# Patient Record
Sex: Female | Born: 1950 | Race: White | Hispanic: No | Smoking: Former smoker
Health system: Southern US, Community
[De-identification: ages and names within clinical notes are randomized; demographics above are authoritative.]

## PROBLEM LIST (undated history)

## (undated) DIAGNOSIS — F329 Major depressive disorder, single episode, unspecified: Secondary | ICD-10-CM

## (undated) DIAGNOSIS — R51 Headache: Secondary | ICD-10-CM

## (undated) DIAGNOSIS — E7800 Pure hypercholesterolemia, unspecified: Secondary | ICD-10-CM

## (undated) DIAGNOSIS — F32A Depression, unspecified: Secondary | ICD-10-CM

## (undated) DIAGNOSIS — H9190 Unspecified hearing loss, unspecified ear: Secondary | ICD-10-CM

## (undated) DIAGNOSIS — M8580 Other specified disorders of bone density and structure, unspecified site: Secondary | ICD-10-CM

## (undated) DIAGNOSIS — R0602 Shortness of breath: Secondary | ICD-10-CM

## (undated) DIAGNOSIS — G5793 Unspecified mononeuropathy of bilateral lower limbs: Secondary | ICD-10-CM

## (undated) DIAGNOSIS — N6019 Diffuse cystic mastopathy of unspecified breast: Secondary | ICD-10-CM

## (undated) DIAGNOSIS — E559 Vitamin D deficiency, unspecified: Secondary | ICD-10-CM

## (undated) DIAGNOSIS — B009 Herpesviral infection, unspecified: Secondary | ICD-10-CM

## (undated) DIAGNOSIS — G5790 Unspecified mononeuropathy of unspecified lower limb: Secondary | ICD-10-CM

## (undated) DIAGNOSIS — Z6825 Body mass index (BMI) 25.0-25.9, adult: Secondary | ICD-10-CM

## (undated) HISTORY — DX: Unspecified mononeuropathy of unspecified lower limb: G57.90

## (undated) HISTORY — DX: Herpesviral infection, unspecified: B00.9

## (undated) HISTORY — DX: Shortness of breath: R06.02

## (undated) HISTORY — DX: Diffuse cystic mastopathy of unspecified breast: N60.19

## (undated) HISTORY — DX: Unspecified mononeuropathy of bilateral lower limbs: G57.93

## (undated) HISTORY — DX: Other specified disorders of bone density and structure, unspecified site: M85.80

## (undated) HISTORY — DX: Body mass index (BMI) 25.0-25.9, adult: Z68.25

## (undated) HISTORY — DX: Pure hypercholesterolemia, unspecified: E78.00

## (undated) HISTORY — DX: Unspecified hearing loss, unspecified ear: H91.90

## (undated) HISTORY — DX: Vitamin D deficiency, unspecified: E55.9

---

## 1999-02-25 ENCOUNTER — Encounter: Admission: RE | Admit: 1999-02-25 | Discharge: 1999-02-25 | Payer: Self-pay | Admitting: Family Medicine

## 1999-02-25 ENCOUNTER — Encounter: Payer: Self-pay | Admitting: Family Medicine

## 1999-10-25 ENCOUNTER — Other Ambulatory Visit: Admission: RE | Admit: 1999-10-25 | Discharge: 1999-10-25 | Payer: Self-pay | Admitting: *Deleted

## 1999-11-11 ENCOUNTER — Encounter: Payer: Self-pay | Admitting: *Deleted

## 1999-11-11 ENCOUNTER — Encounter: Admission: RE | Admit: 1999-11-11 | Discharge: 1999-11-11 | Payer: Self-pay | Admitting: *Deleted

## 2000-12-31 ENCOUNTER — Other Ambulatory Visit: Admission: RE | Admit: 2000-12-31 | Discharge: 2000-12-31 | Payer: Self-pay | Admitting: Family Medicine

## 2000-12-31 ENCOUNTER — Encounter: Payer: Self-pay | Admitting: Family Medicine

## 2000-12-31 ENCOUNTER — Encounter: Admission: RE | Admit: 2000-12-31 | Discharge: 2000-12-31 | Payer: Self-pay | Admitting: Family Medicine

## 2001-01-07 ENCOUNTER — Encounter: Payer: Self-pay | Admitting: Family Medicine

## 2001-01-07 ENCOUNTER — Encounter: Admission: RE | Admit: 2001-01-07 | Discharge: 2001-01-07 | Payer: Self-pay | Admitting: Family Medicine

## 2001-07-29 ENCOUNTER — Encounter: Admission: RE | Admit: 2001-07-29 | Discharge: 2001-08-20 | Payer: Self-pay | Admitting: Orthopedic Surgery

## 2001-12-31 ENCOUNTER — Encounter: Payer: Self-pay | Admitting: Family Medicine

## 2001-12-31 ENCOUNTER — Encounter: Admission: RE | Admit: 2001-12-31 | Discharge: 2001-12-31 | Payer: Self-pay | Admitting: Family Medicine

## 2002-01-30 ENCOUNTER — Ambulatory Visit (HOSPITAL_BASED_OUTPATIENT_CLINIC_OR_DEPARTMENT_OTHER): Admission: RE | Admit: 2002-01-30 | Discharge: 2002-01-30 | Payer: Self-pay | Admitting: Surgery

## 2002-02-10 ENCOUNTER — Encounter: Admission: RE | Admit: 2002-02-10 | Discharge: 2002-02-10 | Payer: Self-pay | Admitting: Family Medicine

## 2002-02-10 ENCOUNTER — Encounter: Payer: Self-pay | Admitting: Family Medicine

## 2002-02-12 ENCOUNTER — Encounter: Admission: RE | Admit: 2002-02-12 | Discharge: 2002-02-12 | Payer: Self-pay | Admitting: Family Medicine

## 2002-02-12 ENCOUNTER — Encounter: Payer: Self-pay | Admitting: Family Medicine

## 2003-03-03 ENCOUNTER — Ambulatory Visit (HOSPITAL_COMMUNITY): Admission: RE | Admit: 2003-03-03 | Discharge: 2003-03-03 | Payer: Self-pay | Admitting: Internal Medicine

## 2003-04-01 ENCOUNTER — Ambulatory Visit (HOSPITAL_COMMUNITY): Admission: RE | Admit: 2003-04-01 | Discharge: 2003-04-01 | Payer: Self-pay | Admitting: Internal Medicine

## 2003-04-22 ENCOUNTER — Encounter: Admission: RE | Admit: 2003-04-22 | Discharge: 2003-05-21 | Payer: Self-pay | Admitting: Internal Medicine

## 2003-05-08 ENCOUNTER — Ambulatory Visit (HOSPITAL_COMMUNITY): Admission: RE | Admit: 2003-05-08 | Discharge: 2003-05-08 | Payer: Self-pay | Admitting: Internal Medicine

## 2004-01-13 ENCOUNTER — Ambulatory Visit: Payer: Self-pay | Admitting: Internal Medicine

## 2004-01-15 ENCOUNTER — Encounter: Admission: RE | Admit: 2004-01-15 | Discharge: 2004-01-15 | Payer: Self-pay | Admitting: Internal Medicine

## 2004-02-01 ENCOUNTER — Ambulatory Visit: Payer: Self-pay | Admitting: Internal Medicine

## 2004-02-04 ENCOUNTER — Ambulatory Visit (HOSPITAL_COMMUNITY): Admission: RE | Admit: 2004-02-04 | Discharge: 2004-02-04 | Payer: Self-pay | Admitting: Internal Medicine

## 2004-02-12 ENCOUNTER — Ambulatory Visit: Payer: Self-pay | Admitting: Internal Medicine

## 2004-02-18 ENCOUNTER — Encounter: Admission: RE | Admit: 2004-02-18 | Discharge: 2004-02-18 | Payer: Self-pay | Admitting: Internal Medicine

## 2004-02-19 ENCOUNTER — Ambulatory Visit (HOSPITAL_COMMUNITY): Admission: RE | Admit: 2004-02-19 | Discharge: 2004-02-19 | Payer: Self-pay | Admitting: Internal Medicine

## 2004-03-25 IMAGING — US US TRANSVAGINAL NON-OB
1 series · 14 of 25 positions shown · non-contrast
Comparison: none

CLINICAL DATA: Vaginal bleeding and pain and cramping.  
 TRANSABDOMINAL AND TRANSVAGINAL PELVIC ULTRASOUND
 The uterus is retroflexed but is normal in size measuring approximately 7 cm in length.  No fibroids are seen.  Several tiny cervical nabothian cysts are noted but no other abnormalities are seen involving the uterus.  On transvaginal sonography the double layer endometrial thickness measures 1.4 mm, which is within normal limits.  
 The left ovary was directly visualized on transvaginal sonography and is normal in appearance.  The right ovary could not be directly visualized by either transabdominal or transvaginal sonography, however no right-sided adnexal masses or cysts are seen.  There is no evidence of free fluid.  
 IMPRESSION
 Retroverted uterus.  Normal endometrial thickness measuring 1.4 mm.  No evidence of fibroids.
 Normal appearance of the left ovary.  Nonvisualization of the right ovary.  No evidence of adnexal mass or free fluid.

[Series 1: unknown · 0.27mm/px · 14 of 32 slices shown]
[im 1/32]
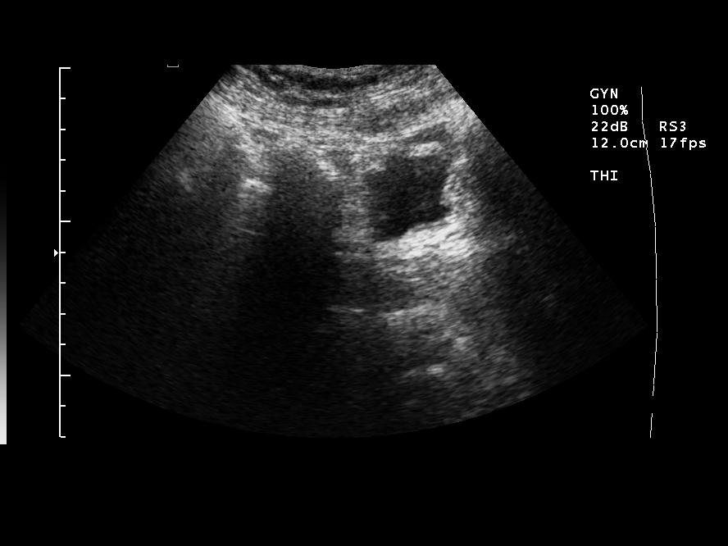
[im 3/32]
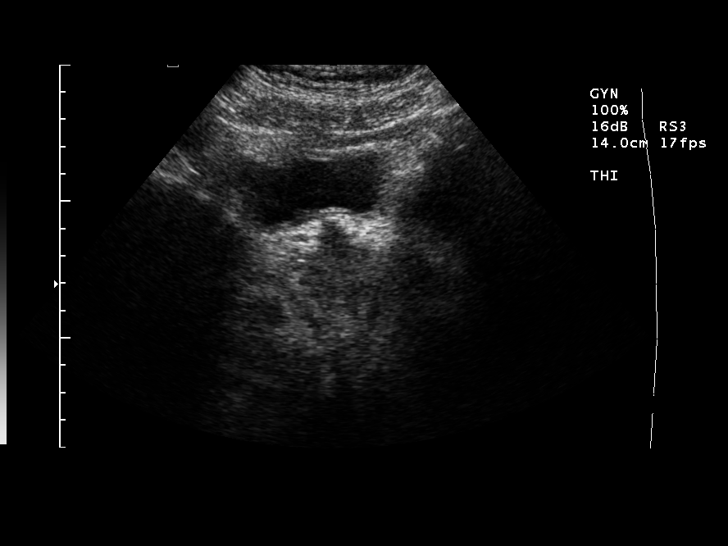
[im 6/32]
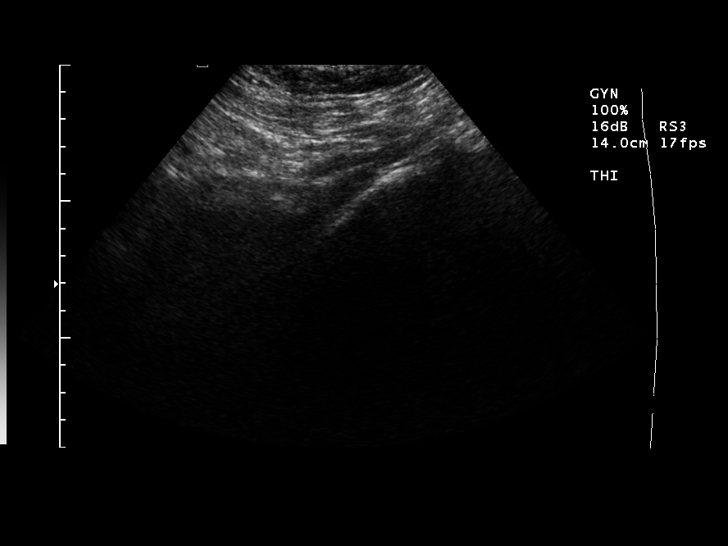
[im 8/32]
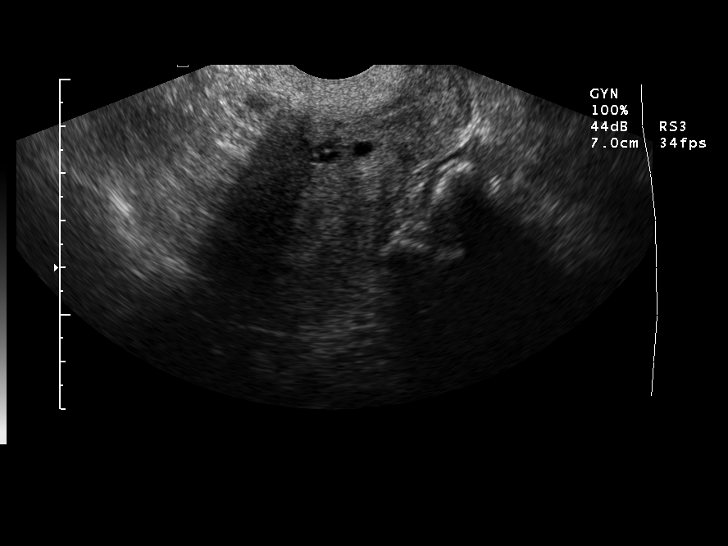
[im 11/32]
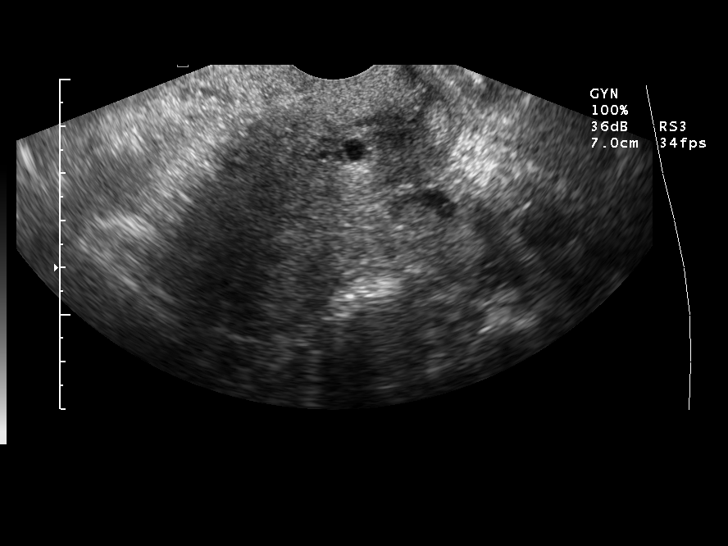
[im 12/32]
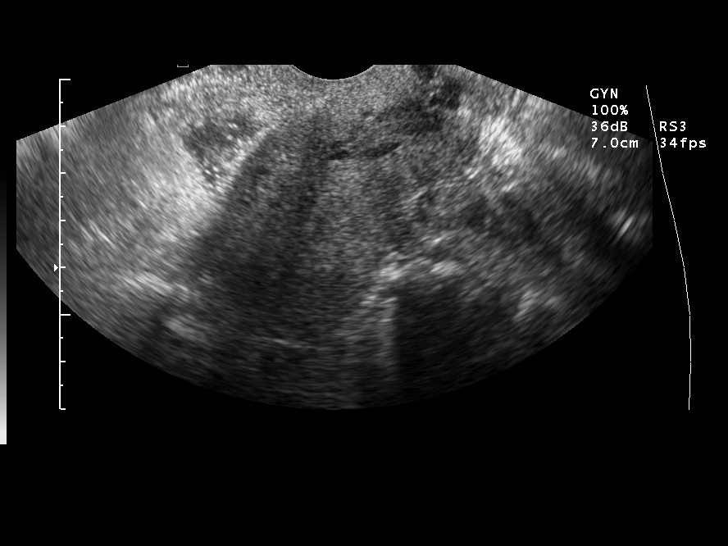
[im 15/32]
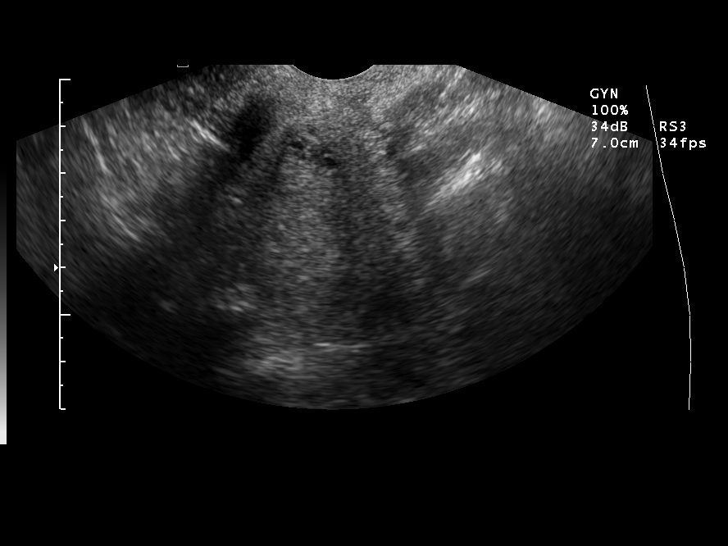
[im 17/32]
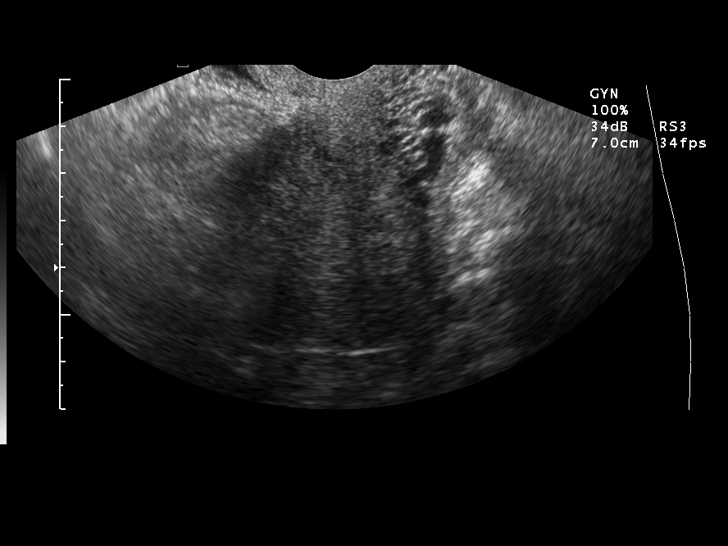
[im 20/32]
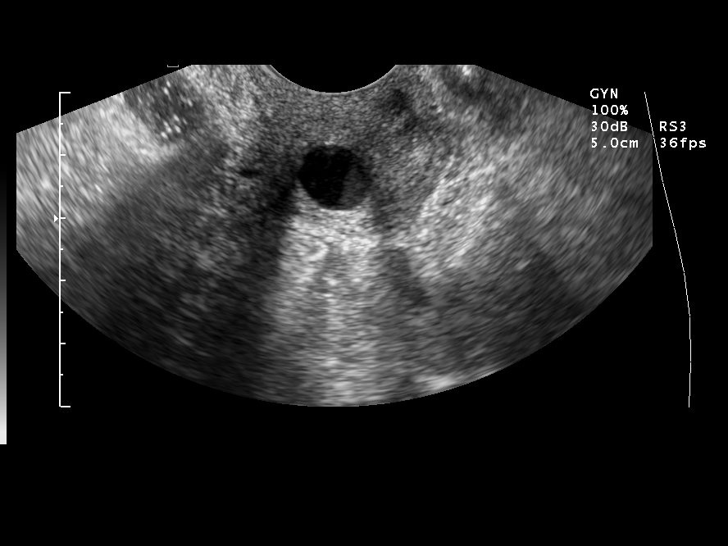
[im 21/32]
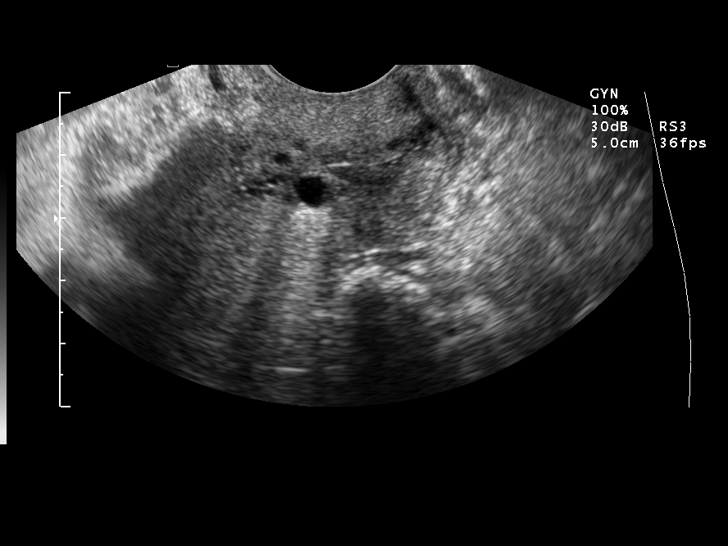
[im 24/32]
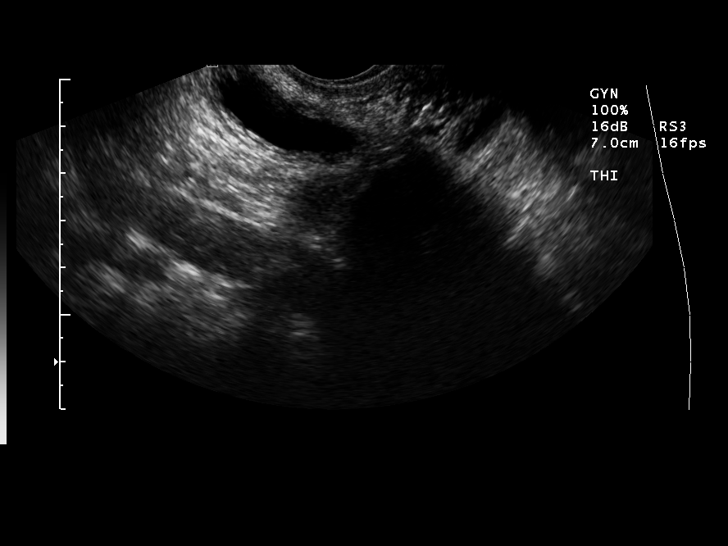
[im 26/32]
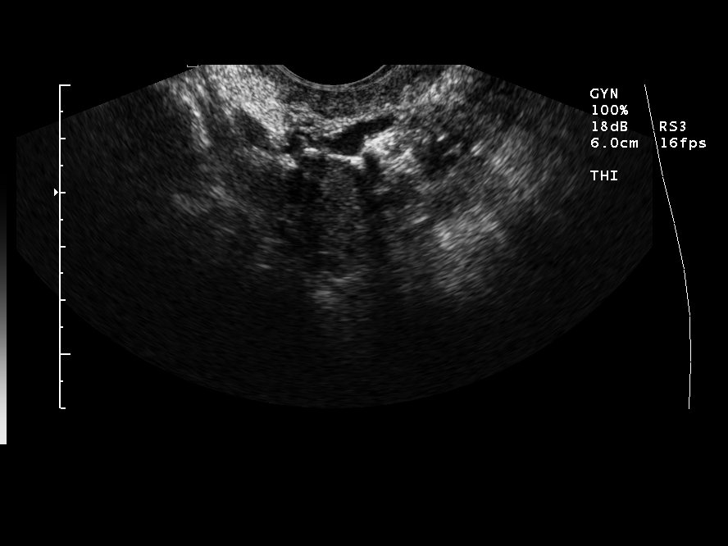
[im 29/32]
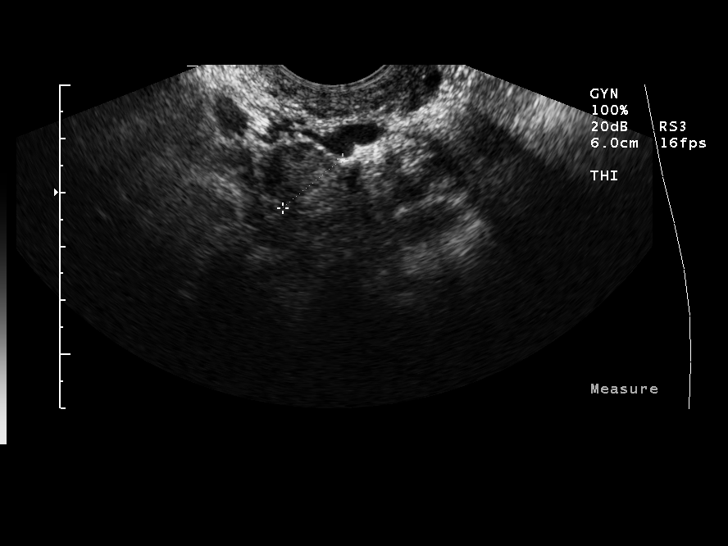
[im 32/32]
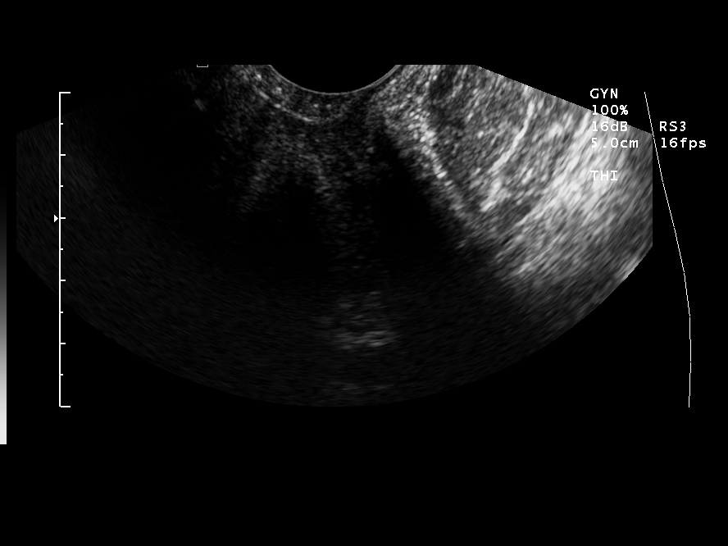

[14 of 25 positions shown; findings below may reference images not displayed]

## 2004-06-23 ENCOUNTER — Ambulatory Visit: Payer: Self-pay | Admitting: Internal Medicine

## 2004-07-14 ENCOUNTER — Ambulatory Visit: Payer: Self-pay | Admitting: Internal Medicine

## 2004-07-17 ENCOUNTER — Ambulatory Visit (HOSPITAL_COMMUNITY): Admission: RE | Admit: 2004-07-17 | Discharge: 2004-07-17 | Payer: Self-pay | Admitting: Internal Medicine

## 2005-03-08 ENCOUNTER — Other Ambulatory Visit: Admission: RE | Admit: 2005-03-08 | Discharge: 2005-03-08 | Payer: Self-pay | Admitting: *Deleted

## 2005-03-23 ENCOUNTER — Encounter: Admission: RE | Admit: 2005-03-23 | Discharge: 2005-03-23 | Payer: Self-pay | Admitting: Family Medicine

## 2005-09-06 ENCOUNTER — Ambulatory Visit: Payer: Self-pay | Admitting: Internal Medicine

## 2005-09-07 ENCOUNTER — Ambulatory Visit (HOSPITAL_COMMUNITY): Admission: RE | Admit: 2005-09-07 | Discharge: 2005-09-07 | Payer: Self-pay | Admitting: Internal Medicine

## 2005-09-18 ENCOUNTER — Ambulatory Visit: Payer: Self-pay | Admitting: Internal Medicine

## 2005-12-05 ENCOUNTER — Ambulatory Visit (HOSPITAL_COMMUNITY): Admission: RE | Admit: 2005-12-05 | Discharge: 2005-12-05 | Payer: Self-pay | Admitting: Interventional Cardiology

## 2006-05-10 ENCOUNTER — Ambulatory Visit: Payer: Self-pay | Admitting: Internal Medicine

## 2006-05-11 ENCOUNTER — Ambulatory Visit: Payer: Self-pay | Admitting: *Deleted

## 2006-06-01 ENCOUNTER — Ambulatory Visit: Payer: Self-pay | Admitting: Internal Medicine

## 2006-06-01 ENCOUNTER — Encounter: Payer: Self-pay | Admitting: Internal Medicine

## 2006-06-07 ENCOUNTER — Ambulatory Visit: Payer: Self-pay | Admitting: Internal Medicine

## 2006-06-09 ENCOUNTER — Emergency Department (HOSPITAL_COMMUNITY): Admission: EM | Admit: 2006-06-09 | Discharge: 2006-06-10 | Payer: Self-pay | Admitting: Emergency Medicine

## 2006-06-14 ENCOUNTER — Ambulatory Visit: Payer: Self-pay | Admitting: Internal Medicine

## 2006-06-19 ENCOUNTER — Ambulatory Visit (HOSPITAL_COMMUNITY): Admission: RE | Admit: 2006-06-19 | Discharge: 2006-06-19 | Payer: Self-pay | Admitting: Internal Medicine

## 2006-06-26 ENCOUNTER — Ambulatory Visit (HOSPITAL_COMMUNITY): Admission: RE | Admit: 2006-06-26 | Discharge: 2006-06-26 | Payer: Self-pay | Admitting: Family Medicine

## 2006-07-27 ENCOUNTER — Ambulatory Visit: Payer: Self-pay | Admitting: Internal Medicine

## 2006-10-24 ENCOUNTER — Emergency Department (HOSPITAL_COMMUNITY): Admission: EM | Admit: 2006-10-24 | Discharge: 2006-10-24 | Payer: Self-pay | Admitting: Emergency Medicine

## 2006-11-30 ENCOUNTER — Emergency Department (HOSPITAL_COMMUNITY): Admission: EM | Admit: 2006-11-30 | Discharge: 2006-11-30 | Payer: Self-pay | Admitting: Emergency Medicine

## 2006-12-06 ENCOUNTER — Ambulatory Visit: Payer: Self-pay | Admitting: Internal Medicine

## 2006-12-26 ENCOUNTER — Encounter (INDEPENDENT_AMBULATORY_CARE_PROVIDER_SITE_OTHER): Payer: Self-pay | Admitting: *Deleted

## 2007-01-28 ENCOUNTER — Ambulatory Visit: Payer: Self-pay | Admitting: Internal Medicine

## 2007-02-11 ENCOUNTER — Ambulatory Visit: Payer: Self-pay | Admitting: Family Medicine

## 2007-03-11 ENCOUNTER — Emergency Department (HOSPITAL_COMMUNITY): Admission: EM | Admit: 2007-03-11 | Discharge: 2007-03-11 | Payer: Self-pay | Admitting: Emergency Medicine

## 2007-06-07 ENCOUNTER — Ambulatory Visit: Payer: Self-pay | Admitting: Family Medicine

## 2007-06-07 ENCOUNTER — Encounter (INDEPENDENT_AMBULATORY_CARE_PROVIDER_SITE_OTHER): Payer: Self-pay | Admitting: Family Medicine

## 2007-06-07 LAB — CONVERTED CEMR LAB
ALT: 19 units/L (ref 0–35)
AST: 23 units/L (ref 0–37)
Albumin: 3.9 g/dL (ref 3.5–5.2)
Alkaline Phosphatase: 54 units/L (ref 39–117)
BUN: 16 mg/dL (ref 6–23)
Basophils Absolute: 0 10*3/uL (ref 0.0–0.1)
Basophils Relative: 0 % (ref 0–1)
CO2: 23 meq/L (ref 19–32)
Calcium: 9.5 mg/dL (ref 8.4–10.5)
Chloride: 104 meq/L (ref 96–112)
Cholesterol: 246 mg/dL — ABNORMAL HIGH (ref 0–200)
Creatinine, Ser: 0.7 mg/dL (ref 0.40–1.20)
Eosinophils Absolute: 0 10*3/uL (ref 0.0–0.7)
Eosinophils Relative: 1 % (ref 0–5)
Glucose, Bld: 91 mg/dL (ref 70–99)
HCT: 39.9 % (ref 36.0–46.0)
HDL: 53 mg/dL (ref >39)
Hemoglobin: 13 g/dL (ref 12.0–15.0)
LDL Cholesterol: 155 mg/dL — ABNORMAL HIGH (ref 0–99)
Lymphocytes Relative: 40 % (ref 12–46)
Lymphs Abs: 2.2 10*3/uL (ref 0.7–4.0)
MCHC: 32.6 g/dL (ref 30.0–36.0)
MCV: 94.3 fL (ref 78.0–100.0)
Monocytes Absolute: 0.5 10*3/uL (ref 0.1–1.0)
Monocytes Relative: 9 % (ref 3–12)
Neutro Abs: 2.7 10*3/uL (ref 1.7–7.7)
Neutrophils Relative %: 51 % (ref 43–77)
Platelets: 185 10*3/uL (ref 150–400)
Potassium: 4.4 meq/L (ref 3.5–5.3)
RBC: 4.23 M/uL (ref 3.87–5.11)
RDW: 13.2 % (ref 11.5–15.5)
Sodium: 141 meq/L (ref 135–145)
TSH: 1.986 microintl units/mL (ref 0.350–5.50)
Total Bilirubin: 0.3 mg/dL (ref 0.3–1.2)
Total CHOL/HDL Ratio: 4.6
Total Protein: 6.4 g/dL (ref 6.0–8.3)
Triglycerides: 188 mg/dL — ABNORMAL HIGH (ref <150)
VLDL: 38 mg/dL (ref 0–40)
WBC: 5.4 10*3/uL (ref 4.0–10.5)

## 2007-07-15 ENCOUNTER — Ambulatory Visit: Payer: Self-pay | Admitting: Internal Medicine

## 2007-08-28 ENCOUNTER — Ambulatory Visit (HOSPITAL_COMMUNITY): Admission: RE | Admit: 2007-08-28 | Discharge: 2007-08-28 | Payer: Self-pay | Admitting: Family Medicine

## 2007-09-04 ENCOUNTER — Ambulatory Visit: Payer: Self-pay | Admitting: Internal Medicine

## 2007-09-17 ENCOUNTER — Ambulatory Visit: Payer: Self-pay | Admitting: Internal Medicine

## 2008-10-13 ENCOUNTER — Ambulatory Visit: Payer: Self-pay | Admitting: Family Medicine

## 2008-12-30 ENCOUNTER — Ambulatory Visit: Payer: Self-pay | Admitting: Internal Medicine

## 2008-12-30 LAB — CONVERTED CEMR LAB
ALT: 18 units/L (ref 0–35)
AST: 25 units/L (ref 0–37)
Albumin: 4.4 g/dL (ref 3.5–5.2)
Alkaline Phosphatase: 52 units/L (ref 39–117)
BUN: 20 mg/dL (ref 6–23)
Basophils Absolute: 0 10*3/uL (ref 0.0–0.1)
Basophils Relative: 0 % (ref 0–1)
CO2: 25 meq/L (ref 19–32)
Calcium: 10.3 mg/dL (ref 8.4–10.5)
Chloride: 104 meq/L (ref 96–112)
Creatinine, Ser: 0.88 mg/dL (ref 0.40–1.20)
Eosinophils Absolute: 0.1 10*3/uL (ref 0.0–0.7)
Eosinophils Relative: 1 % (ref 0–5)
Glucose, Bld: 87 mg/dL (ref 70–99)
HCT: 42.2 % (ref 36.0–46.0)
Hemoglobin: 13.7 g/dL (ref 12.0–15.0)
Lymphocytes Relative: 37 % (ref 12–46)
Lymphs Abs: 2.5 10*3/uL (ref 0.7–4.0)
MCHC: 32.5 g/dL (ref 30.0–36.0)
MCV: 94 fL (ref 78.0–100.0)
Monocytes Absolute: 0.5 10*3/uL (ref 0.1–1.0)
Monocytes Relative: 7 % (ref 3–12)
Neutro Abs: 3.7 10*3/uL (ref 1.7–7.7)
Neutrophils Relative %: 55 % (ref 43–77)
Platelets: 208 10*3/uL (ref 150–400)
Potassium: 5.2 meq/L (ref 3.5–5.3)
RBC: 4.49 M/uL (ref 3.87–5.11)
RDW: 12.5 % (ref 11.5–15.5)
Sodium: 139 meq/L (ref 135–145)
TSH: 1.942 microintl units/mL (ref 0.350–4.500)
Total Bilirubin: 0.3 mg/dL (ref 0.3–1.2)
Total Protein: 7.4 g/dL (ref 6.0–8.3)
WBC: 6.7 10*3/uL (ref 4.0–10.5)

## 2009-04-26 ENCOUNTER — Ambulatory Visit: Payer: Self-pay | Admitting: Internal Medicine

## 2009-05-07 ENCOUNTER — Ambulatory Visit: Payer: Self-pay | Admitting: Family Medicine

## 2009-08-18 ENCOUNTER — Ambulatory Visit (HOSPITAL_COMMUNITY): Admission: RE | Admit: 2009-08-18 | Discharge: 2009-08-18 | Payer: Self-pay | Admitting: Family Medicine

## 2009-08-25 ENCOUNTER — Ambulatory Visit: Payer: Self-pay | Admitting: Family Medicine

## 2009-09-27 ENCOUNTER — Ambulatory Visit: Payer: Self-pay | Admitting: Internal Medicine

## 2009-09-27 LAB — CONVERTED CEMR LAB
ALT: 28 units/L (ref 0–35)
AST: 33 units/L (ref 0–37)
Albumin: 4.4 g/dL (ref 3.5–5.2)
Alkaline Phosphatase: 60 units/L (ref 39–117)
BUN: 17 mg/dL (ref 6–23)
CO2: 24 meq/L (ref 19–32)
Calcium: 10.2 mg/dL (ref 8.4–10.5)
Chloride: 106 meq/L (ref 96–112)
Cholesterol: 306 mg/dL — ABNORMAL HIGH (ref 0–200)
Creatinine, Ser: 0.77 mg/dL (ref 0.40–1.20)
Glucose, Bld: 94 mg/dL (ref 70–99)
HDL: 54 mg/dL (ref >39)
LDL Cholesterol: 229 mg/dL — ABNORMAL HIGH (ref 0–99)
Potassium: 5.3 meq/L (ref 3.5–5.3)
Sodium: 142 meq/L (ref 135–145)
TSH: 3.208 microintl units/mL (ref 0.350–4.500)
Total Bilirubin: 0.7 mg/dL (ref 0.3–1.2)
Total CHOL/HDL Ratio: 5.7
Total Protein: 7.1 g/dL (ref 6.0–8.3)
Triglycerides: 114 mg/dL (ref <150)
VLDL: 23 mg/dL (ref 0–40)

## 2009-10-03 ENCOUNTER — Emergency Department (HOSPITAL_COMMUNITY): Admission: EM | Admit: 2009-10-03 | Discharge: 2009-10-03 | Payer: Self-pay | Admitting: Family Medicine

## 2009-10-04 ENCOUNTER — Telehealth (INDEPENDENT_AMBULATORY_CARE_PROVIDER_SITE_OTHER): Payer: Self-pay | Admitting: *Deleted

## 2009-12-06 ENCOUNTER — Ambulatory Visit: Payer: Self-pay | Admitting: Internal Medicine

## 2010-05-01 ENCOUNTER — Encounter: Payer: Self-pay | Admitting: Interventional Cardiology

## 2010-05-10 NOTE — Progress Notes (Signed)
Summary: triage/abd pain  Phone Note Call from Patient   Caller: Patient Reason for Call: Talk to Nurse Summary of Call: Patient states she went to urgent care on 6/26 because she had been having abd pain for 2 days. She states she has had a fever that was 100.6 at its highest and denies any fever today. She also denies constipation/diarrhea or vomiting. She had normal bloodwork and urinalysis at urgent care. Report states low likelihood of appendicitis. Patient states she wears the leash around her waist when she walks her dog and her dog sometimes pulls alot..Advised patient not to do this as it is also very bad for her back. Patient also found a tick that was attached to her scalp yesterday. She states the tick was not engorged and it was attached. Advised patient to mark calendar as to when she found the tick. Offered patient acute slot in am..however she refused stating she had to work. Advised patient to call us if she changed her mind or she didn't continue to improve.

## 2010-06-26 LAB — POCT URINALYSIS DIP (DEVICE)
Bilirubin Urine: NEGATIVE
Glucose, UA: NEGATIVE mg/dL
Hgb urine dipstick: NEGATIVE
Ketones, ur: NEGATIVE mg/dL
Nitrite: NEGATIVE
Protein, ur: 30 mg/dL — AB
Specific Gravity, Urine: >=1.03 (ref 1.005–1.030)
Urobilinogen, UA: 1 mg/dL (ref 0.0–1.0)
pH: 5.5 (ref 5.0–8.0)

## 2010-06-26 LAB — CBC
HCT: 37.2 % (ref 36.0–46.0)
Hemoglobin: 12.7 g/dL (ref 12.0–15.0)
MCH: 31.6 pg (ref 26.0–34.0)
MCHC: 34 g/dL (ref 30.0–36.0)
MCV: 93 fL (ref 78.0–100.0)
Platelets: 172 10*3/uL (ref 150–400)
RBC: 4 MIL/uL (ref 3.87–5.11)
RDW: 13 % (ref 11.5–15.5)
WBC: 6.5 10*3/uL (ref 4.0–10.5)

## 2010-06-26 LAB — DIFFERENTIAL
Basophils Absolute: 0 10*3/uL (ref 0.0–0.1)
Basophils Relative: 1 % (ref 0–1)
Eosinophils Absolute: 0.1 10*3/uL (ref 0.0–0.7)
Eosinophils Relative: 1 % (ref 0–5)
Lymphocytes Relative: 38 % (ref 12–46)
Lymphs Abs: 2.5 10*3/uL (ref 0.7–4.0)
Monocytes Absolute: 0.6 10*3/uL (ref 0.1–1.0)
Monocytes Relative: 9 % (ref 3–12)
Neutro Abs: 3.3 10*3/uL (ref 1.7–7.7)
Neutrophils Relative %: 52 % (ref 43–77)

## 2010-11-13 ENCOUNTER — Encounter: Payer: Self-pay | Admitting: Emergency Medicine

## 2010-11-13 ENCOUNTER — Emergency Department (HOSPITAL_BASED_OUTPATIENT_CLINIC_OR_DEPARTMENT_OTHER)
Admission: EM | Admit: 2010-11-13 | Discharge: 2010-11-13 | Disposition: A | Discharge status: Home / Self Care | Payer: Self-pay | Attending: Emergency Medicine | Admitting: Emergency Medicine

## 2010-11-13 DIAGNOSIS — S81859A Open bite, unspecified lower leg, initial encounter: Secondary | ICD-10-CM

## 2010-11-13 DIAGNOSIS — S81009A Unspecified open wound, unspecified knee, initial encounter: Secondary | ICD-10-CM | POA: Insufficient documentation

## 2010-11-13 DIAGNOSIS — W540XXA Bitten by dog, initial encounter: Secondary | ICD-10-CM | POA: Insufficient documentation

## 2010-11-13 DIAGNOSIS — Y92009 Unspecified place in unspecified non-institutional (private) residence as the place of occurrence of the external cause: Secondary | ICD-10-CM | POA: Insufficient documentation

## 2010-11-13 HISTORY — DX: Headache: R51

## 2010-11-13 HISTORY — DX: Depression, unspecified: F32.A

## 2010-11-13 HISTORY — DX: Major depressive disorder, single episode, unspecified: F32.9

## 2010-11-13 MED ORDER — AMOXICILLIN-POT CLAVULANATE 875-125 MG PO TABS: 1.0000 | ORAL_TABLET | Freq: Two times a day (BID) | ORAL | Status: AC

## 2010-11-13 MED ORDER — AMOXICILLIN-POT CLAVULANATE 875-125 MG PO TABS
1.0000 | ORAL_TABLET | Freq: Once | ORAL | Status: AC
  Administered 2010-11-13: 1 via ORAL
  Filled 2010-11-13: qty 1

## 2010-11-13 NOTE — ED Notes (Signed)
Per EMS:  Pt has dog bite on right lower leg this am.  Pt has two puncture wounds.  Pt bit by own dog.  Shots up to date.

## 2010-11-13 NOTE — ED Provider Notes (Addendum)
History     CSN: [id number] Arrival date & time: 11/13/2010  8:50 AM  Chief Complaint  Patient presents with  . Animal Bite   HPI Comments: Patient is a 60 year old woman who was walking to her dogs. Dog belonging to her neighbor attacked her dogs. She tried to separate the dogs, and when she did, her own dog bit her on the right lower leg. She is up-to-date on tetanus shots. Her dog has been immunized against rabies.  Patient is a 60 y.o. female presenting with animal bite. The history is provided by the patient and the EMS personnel. No language interpreter was used.  Animal Bite  The incident occurred just prior to arrival. The incident occurred in the street. She came to the ER via EMS. There is an injury to the right lower leg. The pain is mild. It is unlikely that a foreign body is present.    Past Medical History  Diagnosis Date  . Depression   . Headache     Past Surgical History  Procedure Date  . Cesarean section     History reviewed. No pertinent family history.  History  Substance Use Topics  . Smoking status: Never Smoker   . Smokeless tobacco: Not on file  . Alcohol Use: No    OB History    Grav Para Term Preterm Abortions TAB SAB Ect Mult Living                  Review of Systems  All other systems reviewed and are negative.    Physical Exam  BP 149/85  Pulse 76  Temp(Src) 97.6 F (36.4 C) (Oral)  Resp 18  SpO2 100%  Physical Exam  Constitutional: She is oriented to person, place, and time. She appears well-developed and well-nourished. Distressed: she is in mild distress with plates on the right lower leg.  HENT:  Head: Normocephalic.  Eyes: EOM are normal.  Neck: Normal range of motion. Neck supple.  Musculoskeletal:       Patient has 21 cm bite mark/lacerations on the anterior surface of the right calf. There are scratches along the right calf.  Neurological: She is alert and oriented to person, place, and time.       No sensory or motor  deficit.  Skin: Skin is warm and dry.  Psychiatric: She has a normal mood and affect. Her behavior is normal.    ED Course  Procedures  Course in the ED: Patient was seen, and had physical exam. We discussed treatment options. Where her dog bite wounds gape open somewhat, I had suggested suturing them. The patient was concerned about this, so in the end we decided to adopt a conservative approach, washing the wounds and leaving them open, and treating her with Augmentin to try to prevent infection. She does not need a tetanus shot, as the dog that bit her is immunized against rabies.   Carleene Cooper III, MD 11/13/10 [id number]  Carleene Cooper III, MD 11/13/10 [phone]

## 2010-11-13 NOTE — ED Notes (Signed)
Wounds cleansed with betadine and wound cleansing spray.  Applied bacitracin ointment, dry sterile dressing and secured with tape.  Pt tolerated well.

## 2010-11-13 NOTE — ED Notes (Signed)
Pt given cab voucher for ride home 

## 2010-11-24 ENCOUNTER — Inpatient Hospital Stay (INDEPENDENT_AMBULATORY_CARE_PROVIDER_SITE_OTHER)
Admission: RE | Admit: 2010-11-24 | Discharge: 2010-11-24 | Disposition: A | Discharge status: Home / Self Care | Payer: Self-pay | Source: Ambulatory Visit | Attending: Emergency Medicine | Admitting: Emergency Medicine

## 2010-11-24 DIAGNOSIS — S81809A Unspecified open wound, unspecified lower leg, initial encounter: Secondary | ICD-10-CM

## 2011-01-20 LAB — POCT URINALYSIS DIP (DEVICE)
Bilirubin Urine: NEGATIVE
Glucose, UA: NEGATIVE
Hgb urine dipstick: NEGATIVE
Ketones, ur: NEGATIVE
Nitrite: NEGATIVE
Operator id: 200941
Protein, ur: NEGATIVE
Specific Gravity, Urine: 1.01
Urobilinogen, UA: 0.2
pH: 7.5

## 2011-01-23 LAB — POCT URINALYSIS DIP (DEVICE)
Bilirubin Urine: NEGATIVE
Glucose, UA: NEGATIVE
Ketones, ur: NEGATIVE
Nitrite: NEGATIVE
Operator id: 247071
Protein, ur: NEGATIVE
Specific Gravity, Urine: 1.015
Urobilinogen, UA: 0.2
pH: 5.5

## 2011-05-11 ENCOUNTER — Emergency Department (HOSPITAL_COMMUNITY)
Admission: EM | Admit: 2011-05-11 | Discharge: 2011-05-11 | Disposition: A | Discharge status: Home / Self Care | Payer: Self-pay | Source: Home / Self Care | Attending: Family Medicine | Admitting: Family Medicine

## 2011-05-11 ENCOUNTER — Encounter (HOSPITAL_COMMUNITY): Payer: Self-pay | Admitting: *Deleted

## 2011-05-11 DIAGNOSIS — J40 Bronchitis, not specified as acute or chronic: Secondary | ICD-10-CM

## 2011-05-11 DIAGNOSIS — J069 Acute upper respiratory infection, unspecified: Secondary | ICD-10-CM

## 2011-05-11 MED ORDER — AZITHROMYCIN 250 MG PO TABS: 250.0000 mg | ORAL_TABLET | Freq: Every day | ORAL | Status: AC

## 2011-05-11 MED ORDER — GUAIFENESIN-CODEINE 100-10 MG/5ML PO SYRP: 5.0000 mL | ORAL_SOLUTION | Freq: Four times a day (QID) | ORAL | Status: AC | PRN

## 2011-05-11 MED ORDER — NABUMETONE 500 MG PO TABS: 500.0000 mg | ORAL_TABLET | Freq: Two times a day (BID) | ORAL | Status: AC

## 2011-05-11 NOTE — ED Notes (Signed)
Pt  Also  Reports      Some   Neck   Pain        Which      She  Has      Had  In  Past  She  denys  Any  specefic  Recent      Injury  And  The  Pain is  Worse  On  Movement

## 2011-05-11 NOTE — ED Provider Notes (Signed)
History     CSN: [id number]  Arrival date & time 05/11/11  1005   First MD Initiated Contact with Patient 05/11/11 1152      Chief Complaint  Patient presents with  . Cough    (Consider location/radiation/quality/duration/timing/severity/associated sxs/prior treatment) HPI Comments: Patient reports cough and congestion x 5 days. No fever. Cough is dry with occ green sputum. Has runny nose and sneezing. Cough keeping her awake and causing her chest to hurt. No treatment pta  The history is provided by the patient.    Past Medical History  Diagnosis Date  . Depression   . Headache     Past Surgical History  Procedure Date  . Cesarean section     History reviewed. No pertinent family history.  History  Substance Use Topics  . Smoking status: Never Smoker   . Smokeless tobacco: Not on file  . Alcohol Use: No    OB History    Grav Para Term Preterm Abortions TAB SAB Ect Mult Living                  Review of Systems  Constitutional: Negative.   HENT: Positive for congestion and rhinorrhea. Negative for sore throat.   Respiratory: Positive for cough. Negative for wheezing.   Cardiovascular: Negative.   Gastrointestinal: Negative.   Genitourinary: Negative.     Allergies  Sulfa antibiotics  Home Medications   Current Outpatient Rx  Name Route Sig Dispense Refill  . AZITHROMYCIN 250 MG PO TABS Oral Take 1 tablet (250 mg total) by mouth daily. Take first 2 tablets together, then 1 every day until finished. 6 tablet 0  . FLUOXETINE HCL 10 MG PO TABS Oral Take 10 mg by mouth daily.      . GUAIFENESIN-CODEINE 100-10 MG/5ML PO SYRP Oral Take 5 mLs by mouth every 6 (six) hours as needed for cough. 120 mL 0  . NABUMETONE 500 MG PO TABS Oral Take 500 mg by mouth 2 (two) times daily.      Marland Kitchen NABUMETONE 500 MG PO TABS Oral Take 1 tablet (500 mg total) by mouth 2 (two) times daily. 60 tablet 0    BP 121/83  Pulse 80  Temp(Src) 99.4 F (37.4 C) (Oral)  Resp 19   SpO2 97%  Physical Exam  Constitutional: She appears well-developed and well-nourished. No distress.  HENT:  Head: Normocephalic and atraumatic.       Nasal congestion, ears clear, throat mild erythema  Neck: Normal range of motion. Neck supple. No thyromegaly present.  Cardiovascular: Normal rate and regular rhythm.   Pulmonary/Chest: Effort normal and breath sounds normal. She has no wheezes.       Congested cough  Lymphadenopathy:    She has no cervical adenopathy.  Skin: Skin is warm and dry.    ED Course  Procedures (including critical care time)  Labs Reviewed - No data to display No results found.   1. URI (upper respiratory infection)   2. Bronchitis       MDM          Randa Spike, MD 05/11/11 1256

## 2011-05-11 NOTE — ED Notes (Signed)
Pt  Reports  Symptoms  Of  Cough     Congestion    Stuffy  Nose              With  Sneezing       Symptoms   X  5  Days      The  Cough  Became  Productive  2  Days   Ago      Pt  Is  Masked       And  Is  In a  Private  Room              She  Is  Speaking  In  Complete  sentances

## 2011-05-17 ENCOUNTER — Telehealth (HOSPITAL_COMMUNITY): Payer: Self-pay | Admitting: *Deleted

## 2011-09-13 ENCOUNTER — Ambulatory Visit (HOSPITAL_COMMUNITY)
Admission: RE | Admit: 2011-09-13 | Discharge: 2011-09-13 | Disposition: A | Discharge status: Home / Self Care | Payer: Self-pay | Source: Ambulatory Visit | Attending: Family Medicine | Admitting: Family Medicine

## 2011-09-13 ENCOUNTER — Other Ambulatory Visit: Payer: Self-pay | Admitting: Family Medicine

## 2011-09-13 DIAGNOSIS — M5030 Other cervical disc degeneration, unspecified cervical region: Secondary | ICD-10-CM | POA: Insufficient documentation

## 2011-09-13 DIAGNOSIS — R52 Pain, unspecified: Secondary | ICD-10-CM | POA: Insufficient documentation

## 2011-09-13 DIAGNOSIS — M542 Cervicalgia: Secondary | ICD-10-CM | POA: Insufficient documentation

## 2011-12-30 ENCOUNTER — Emergency Department (INDEPENDENT_AMBULATORY_CARE_PROVIDER_SITE_OTHER): Payer: Self-pay

## 2011-12-30 ENCOUNTER — Encounter (HOSPITAL_COMMUNITY): Payer: Self-pay

## 2011-12-30 ENCOUNTER — Emergency Department (HOSPITAL_COMMUNITY)
Admission: EM | Admit: 2011-12-30 | Discharge: 2011-12-30 | Disposition: A | Discharge status: Home / Self Care | Payer: Self-pay | Source: Home / Self Care | Attending: Emergency Medicine | Admitting: Emergency Medicine

## 2011-12-30 DIAGNOSIS — M719 Bursopathy, unspecified: Secondary | ICD-10-CM

## 2011-12-30 DIAGNOSIS — M7580 Other shoulder lesions, unspecified shoulder: Secondary | ICD-10-CM

## 2011-12-30 DIAGNOSIS — M67919 Unspecified disorder of synovium and tendon, unspecified shoulder: Secondary | ICD-10-CM

## 2011-12-30 MED ORDER — METHYLPREDNISOLONE ACETATE 40 MG/ML IJ SUSP
INTRAMUSCULAR | Status: AC
  Filled 2011-12-30: qty 5

## 2011-12-30 NOTE — ED Notes (Signed)
Pt has lt shoulder pain and no known specific injury, but has been lifting her 50 lb dog and moving doghouses.

## 2011-12-30 NOTE — ED Provider Notes (Signed)
Chief Complaint  Patient presents with  . Shoulder Pain    History of Present Illness:   Caitlyn Fields is a 61 year old female who has had a 2 month history of left shoulder pain. She denies any specific injury to the shoulder but does a lot of heavy lifting of her dogs and lifts heavy buckets of water for her dogs. She describes pain in the posterior lateral shoulder area with radiation to the upper arm. This feels like a burning. She denies any radiation down below the elbow, numbness, tingling, or weakness. There is no swelling. Her shoulder has a full range of motion. She denies any pain with abduction. She sometimes feels like it's worse at the shoulders compressed.  Review of Systems:  Other than noted above, the patient denies any of the following symptoms: Systemic:  No fevers, chills, sweats, or aches.  No fatigue or tiredness. Musculoskeletal:  No joint pain, arthritis, bursitis, swelling, back pain, or neck pain. Neurological:  No muscular weakness, paresthesias, headache, or trouble with speech or coordination.  No dizziness.  PMFSH:  Past medical history, family history, social history, meds, and allergies were reviewed.  Physical Exam:   Vital signs:  BP 138/78  Pulse 75  Temp 97.8 F (36.6 C) (Oral)  Resp 16  SpO2 100% Gen:  Alert and oriented times 3.  In no distress. Musculoskeletal: No swelling, bruising, or deformity. There was no pain to palpation. Shoulder has a full range of motion with no pain. Neer test was negative.  Hawkins test was negative.  Empty cans test was negative. Otherwise, all joints had a full a ROM with no swelling, bruising or deformity.  No edema, pulses full. Extremities were warm and pink.  Capillary refill was brisk.  Skin:  Clear, warm and dry.  No rash. Neuro:  Alert and oriented times 3.  Muscle strength was normal.  Sensation was intact to light touch.   Radiology:  Dg Shoulder Left  12/30/2011  *RADIOLOGY REPORT*  Clinical Data: Left shoulder pain   LEFT SHOULDER - 2+ VIEW  Comparison: None.  Findings: No fracture or dislocation is seen.  The joint spaces are preserved.  The visualized soft tissues are unremarkable.  Visualized left lung is clear.  IMPRESSION: No acute osseous abnormality is seen.   Original Report Authenticated By: Charline Bills, M.D.    I reviewed the images independently and personally and concur with the radiologist's findings.  Procedure Note:  Verbal informed consent was obtained from the patient.  Risks and benefits were outlined with the patient.  Patient understands and accepts these risks.  Identity of the patient was confirmed verbally and by armband.    Procedure was performed as followed:  Posterior aspect of the shoulder was prepped with Betadine and alcohol and anesthetized with ethyl chloride spray. The subacromial space was an area with a 1/2 inch 27-gauge needle and 1 cc of Depo-Medrol 40 mg strength and 1 cc of Marcaine were injected.  Patient tolerated the procedure well without any immediate complications.  Assessment:  The encounter diagnosis was Rotator cuff tendonitis.  Plan:   1.  The following meds were prescribed:   New Prescriptions   No medications on file   2.  The patient was instructed in symptomatic care, including rest and activity, elevation, application of ice and compression.  Appropriate handouts were given. 3.  The patient was told to return if becoming worse in any way, if no better in 3 or 4 days, and given some  red flag symptoms that would indicate earlier return.   4.  The patient was told to follow up with Dr. Annell Greening if no better in 2 weeks.    Reuben Likes, MD 12/30/11 2035

## 2011-12-30 NOTE — ED Notes (Signed)
Patient silver necklace with white and silver charm removed for xray and placed in purse per patient's instructions

## 2011-12-31 ENCOUNTER — Telehealth (HOSPITAL_COMMUNITY): Payer: Self-pay | Admitting: Emergency Medicine

## 2011-12-31 NOTE — ED Notes (Signed)
Patient called today requesting to know what medication she was given yesterday.  Patient's DOB was verified.  Patient was informed that Dr Lorenz Coaster used Depo-Medrol with Marcaine.  Patient state she had increased pain in her shoulder.  Dr Lorenz Coaster notified and spoke with patient directly.  He advised patient that pain can become worse before it gets better.  He advised patient to take pain medication.  Patient did not have any further needs

## 2012-05-25 ENCOUNTER — Other Ambulatory Visit: Payer: Self-pay

## 2013-02-13 ENCOUNTER — Other Ambulatory Visit: Payer: Self-pay

## 2013-07-23 ENCOUNTER — Ambulatory Visit: Payer: 59 | Admitting: Diagnostic Neuroimaging

## 2013-09-08 ENCOUNTER — Other Ambulatory Visit (HOSPITAL_COMMUNITY)
Admission: RE | Admit: 2013-09-08 | Discharge: 2013-09-08 | Disposition: A | Discharge status: Home / Self Care | Payer: Commercial Managed Care - PPO | Source: Ambulatory Visit | Attending: Family Medicine | Admitting: Family Medicine

## 2013-09-08 ENCOUNTER — Other Ambulatory Visit: Payer: Self-pay | Admitting: Family Medicine

## 2013-09-08 DIAGNOSIS — Z124 Encounter for screening for malignant neoplasm of cervix: Secondary | ICD-10-CM | POA: Insufficient documentation

## 2013-09-09 ENCOUNTER — Other Ambulatory Visit (HOSPITAL_COMMUNITY): Payer: Self-pay | Admitting: Family Medicine

## 2013-09-09 DIAGNOSIS — Z1231 Encounter for screening mammogram for malignant neoplasm of breast: Secondary | ICD-10-CM

## 2013-10-13 ENCOUNTER — Ambulatory Visit (HOSPITAL_COMMUNITY): Admission: RE | Admit: 2013-10-13 | Payer: Commercial Managed Care - PPO | Source: Ambulatory Visit

## 2014-01-29 ENCOUNTER — Ambulatory Visit (HOSPITAL_COMMUNITY): Payer: Commercial Managed Care - PPO | Admitting: Psychiatry

## 2014-05-05 ENCOUNTER — Ambulatory Visit: Payer: Self-pay

## 2014-08-03 ENCOUNTER — Emergency Department (INDEPENDENT_AMBULATORY_CARE_PROVIDER_SITE_OTHER): Payer: Self-pay

## 2014-08-03 ENCOUNTER — Encounter (HOSPITAL_COMMUNITY): Payer: Self-pay | Admitting: Emergency Medicine

## 2014-08-03 ENCOUNTER — Emergency Department (INDEPENDENT_AMBULATORY_CARE_PROVIDER_SITE_OTHER)
Admission: EM | Admit: 2014-08-03 | Discharge: 2014-08-03 | Disposition: A | Discharge status: Home / Self Care | Payer: Self-pay | Source: Home / Self Care | Attending: Emergency Medicine | Admitting: Emergency Medicine

## 2014-08-03 DIAGNOSIS — S39011A Strain of muscle, fascia and tendon of abdomen, initial encounter: Secondary | ICD-10-CM

## 2014-08-03 DIAGNOSIS — K219 Gastro-esophageal reflux disease without esophagitis: Secondary | ICD-10-CM

## 2014-08-03 MED ORDER — GI COCKTAIL ~~LOC~~
ORAL | Status: AC
  Filled 2014-08-03: qty 30

## 2014-08-03 MED ORDER — GI COCKTAIL ~~LOC~~
30.0000 mL | Freq: Once | ORAL | Status: AC
  Administered 2014-08-03: 30 mL via ORAL

## 2014-08-03 MED ORDER — OMEPRAZOLE 20 MG PO CPDR: 20.0000 mg | DELAYED_RELEASE_CAPSULE | Freq: Every day | ORAL | Status: DC

## 2014-08-03 NOTE — Discharge Instructions (Signed)
I think your symptoms are likely coming from a combination of heartburn and muscle strain. Take omeprazole daily for the next 2 weeks. If this is beneficial, he can continue to take it on an as-needed basis. Take 2 extra strength Tylenol 3 times a day for the next 5 days for muscle strain. Apply ice to the area 3 times a day for the next 5 days for muscle strain. Follow-up as needed.

## 2014-08-03 NOTE — ED Provider Notes (Signed)
CSN: [id number]     Arrival date & time 08/03/14  1400 History   First MD Initiated Contact with Patient 08/03/14 1449     Chief Complaint  Patient presents with  . Abdominal Pain   (Consider location/radiation/quality/duration/timing/severity/associated sxs/prior Treatment) HPI She is a 64 year old woman here for evaluation of epigastric pain. She states this started on Friday with a sharp pain in the epigastric area that radiated up into her sternal area. Since then she has continued to have a pressure sensation of at her lower sternum/epigastric area. She denies any nausea or vomiting. No diarrhea. No associated shortness of breath, dizziness, diaphoresis. She does state she will have occasional brief episodes of dizziness. She's been working out in her garden and picked up something heavier than she really wanted to. She googled her symptoms and is concerned for a hiatal hernia. She has tried an over-the-counter heartburn medication with some improvement.  Past Medical History  Diagnosis Date  . Depression   . [id number](478.2)    Past Surgical History  Procedure Laterality Date  . Cesarean section     History reviewed. No pertinent family history. History  Substance Use Topics  . Smoking status: Never Smoker   . Smokeless tobacco: Not on file  . Alcohol Use: No   OB History    No data available     Review of Systems  Constitutional: Negative for fever and chills.  HENT: Negative.   Respiratory: Negative for cough and shortness of breath.   Cardiovascular: Negative for chest pain.  Gastrointestinal: Positive for abdominal pain (epigastric). Negative for nausea, vomiting and diarrhea.  Neurological: Positive for dizziness. Negative for headaches.    Allergies  Sulfa antibiotics  Home Medications   Prior to Admission medications   Medication Sig Start Date End Date Taking? Authorizing Provider  FLUoxetine (PROZAC) 10 MG tablet Take 10 mg by mouth daily.      Historical  Provider, MD  nabumetone (RELAFEN) 500 MG tablet Take 500 mg by mouth 2 (two) times daily.      Historical Provider, MD  omeprazole (PRILOSEC) 20 MG capsule Take 1 capsule (20 mg total) by mouth daily. Daily for 2 weeks, then as needed. 08/03/14   Charm Rings, MD   BP 105/68 mmHg  Pulse 68  Temp(Src) 98.5 F (36.9 C) (Oral)  Resp 18  SpO2 97% Physical Exam  Constitutional: She is oriented to person, place, and time. She appears well-developed and well-nourished. No distress.  Neck: Neck supple.  Cardiovascular: Normal rate, regular rhythm and normal heart sounds.   No murmur heard. Pulmonary/Chest: Effort normal and breath sounds normal. No respiratory distress. She has no wheezes. She has no rales. She exhibits no tenderness.  Abdominal: Soft. Bowel sounds are normal. She exhibits no distension and no mass. There is tenderness (In epigastric). There is no rebound and no guarding.  Neurological: She is alert and oriented to person, place, and time.    ED Course  Procedures (including critical care time) ED ECG REPORT   Date: 08/03/2014  Rate: 65  Rhythm: normal sinus rhythm  QRS Axis: normal  Intervals: normal  ST/T Wave abnormalities: normal  Conduction Disutrbances:none  Narrative Interpretation: normal ekg Old EKG Reviewed: none available  I have personally reviewed the EKG tracing and agree with the computerized printout as noted.  Labs Review Labs Reviewed - No data to display  Imaging Review Dg Chest 2 View  08/03/2014   CLINICAL DATA:  Epigastric pain.  EXAM: CHEST  2 VIEW  COMPARISON:  09/07/2005  FINDINGS: The heart size and mediastinal contours are within normal limits. Lungs are mildly hyperexpanded but clear. No pleural effusion or pneumothorax.  Bony thorax is intact.  IMPRESSION: No active cardiopulmonary disease.   Electronically Signed   By: Amie Portlandavid  Ormond M.D.   On: 08/03/2014 16:03     MDM   1. Gastroesophageal reflux disease, esophagitis presence not  specified   2. Abdominal muscle strain, initial encounter    GI cocktail given.  Pain improved after GI cocktail. X-ray and EKG are normal. Suspect pain is a combination of reflux and muscle wall strain. We'll treat with two-week trial of omeprazole. Tylenol and ice for muscle wall strain. Follow-up as needed.  Charm RingsErin J Joyceann Kruser, MD 08/03/14 1640

## 2014-08-03 NOTE — ED Notes (Signed)
Pt states that she has had upper abdominal pain since 07/31/2014

## 2014-08-31 ENCOUNTER — Ambulatory Visit: Payer: Self-pay | Attending: Family Medicine

## 2014-09-25 ENCOUNTER — Ambulatory Visit: Payer: Self-pay | Attending: Internal Medicine | Admitting: Internal Medicine

## 2014-09-25 ENCOUNTER — Encounter: Payer: Self-pay | Admitting: Internal Medicine

## 2014-09-25 VITALS — BP 112/77 | HR 71 | Wt 144.8 lb

## 2014-09-25 DIAGNOSIS — M79671 Pain in right foot: Secondary | ICD-10-CM

## 2014-09-25 DIAGNOSIS — M79672 Pain in left foot: Secondary | ICD-10-CM

## 2014-09-25 DIAGNOSIS — Z1239 Encounter for other screening for malignant neoplasm of breast: Secondary | ICD-10-CM

## 2014-09-25 DIAGNOSIS — K088 Other specified disorders of teeth and supporting structures: Secondary | ICD-10-CM

## 2014-09-25 DIAGNOSIS — K0889 Other specified disorders of teeth and supporting structures: Secondary | ICD-10-CM

## 2014-09-25 DIAGNOSIS — M25562 Pain in left knee: Secondary | ICD-10-CM

## 2014-09-25 NOTE — Progress Notes (Signed)
Patient ID: Caitlyn Fields, female   DOB: [birth year], 64 y.o.   MRN: [id number]   YTK:[id number]  [id number]  DOB - [birth year]  CC:  Chief Complaint  Patient presents with  . New patient    Dental referral/ eye referral   . Left knee pain       HPI: Caitlyn Fields is a 64 y.o. female here today to establish medical care. Patient has a past medical history of depression. She presents today with concerns of left knee pain for the past one year. She was told that she had a meniscal  tear while at [facility] but has not had a follow up since they closed down. She has been seen by [facility] once for xrays. She is able to walk without difficulty but is unable to fully bend left knee.   Left sided dental pain with broken partial. No eye concerns she wants yearly eye exam   Patient has No headache, No chest pain, No abdominal pain - No Nausea, No new weakness tingling or numbness, No Cough - SOB.  Allergies  Allergen Reactions  . Sulfa Antibiotics Itching   Past Medical History  Diagnosis Date  . Depression   . HQPRFFMB(846.6)    Current Outpatient Prescriptions on File Prior to Visit  Medication Sig Dispense Refill  . FLUoxetine (PROZAC) 10 MG tablet Take 10 mg by mouth daily.      . nabumetone (RELAFEN) 500 MG tablet Take 500 mg by mouth 2 (two) times daily.      Marland Kitchen omeprazole (PRILOSEC) 20 MG capsule Take 1 capsule (20 mg total) by mouth daily. Daily for 2 weeks, then as needed. (Patient not taking: Reported on 09/25/2014) 30 capsule 0   No current facility-administered medications on file prior to visit.   No family history on file. History   Social History  . Marital Status: Divorced    Spouse Name: N/A  . Number of Children: N/A  . Years of Education: N/A   Occupational History  . Not on file.   Social History Main Topics  . Smoking status: Never Smoker   . Smokeless tobacco: Not on file  . Alcohol Use: No  . Drug Use: No  . Sexual Activity:  No   Other Topics Concern  . Not on file   Social History Narrative    Review of Systems  Eyes: Negative for blurred vision.  Genitourinary: Negative for frequency.  Musculoskeletal: Positive for joint pain. Negative for falls.  Neurological: Positive for tingling (bilateral feet). Negative for dizziness and headaches.  Endo/Heme/Allergies: Negative for polydipsia.  All other systems reviewed and are negative.   Objective:   Filed Vitals:   09/25/14 1411  BP: 112/77  Pulse: 71    Physical Exam  Constitutional: She is oriented to person, place, and time.  HENT:  Mouth/Throat:    Eyes: EOM are normal. Pupils are equal, round, and reactive to light.  Cardiovascular: Normal rate, regular rhythm and normal heart sounds.   Pulmonary/Chest: Effort normal and breath sounds normal.  Musculoskeletal: She exhibits no edema or tenderness.  Neurological: She is alert and oriented to person, place, and time.   Lab Results  Component Value Date   WBC 6.5 10/03/2009   HGB 12.7 10/03/2009   HCT 37.2 10/03/2009   MCV 93.0 10/03/2009   PLT 172 10/03/2009   Lab Results  Component Value Date   CREATININE 0.77 09/27/2009   BUN 17 09/27/2009   NA 142 09/27/2009  K 5.3 09/27/2009   CL 106 09/27/2009   CO2 24 09/27/2009    No results found for: HGBA1C Lipid Panel     Component Value Date/Time   CHOL 306* 09/27/2009 2045   TRIG 114 09/27/2009 2045   HDL 54 09/27/2009 2045   CHOLHDL 5.7 Ratio 09/27/2009 2045   VLDL 23 09/27/2009 2045   LDLCALC 229* 09/27/2009 2045       Assessment and plan:   Caitlyn Fields was seen today for new patient and left knee pain.  Diagnoses and all orders for this visit:  Left knee pain Orders: -     AMB referral to orthopedics Continue tylenol for pain  Bilateral foot pain Orders: -     Ambulatory referral to Podiatry Patient is adamant about seeing podiatry and would not clearly explain symptoms. She wants specialist to care for her  feet.  Pain, dental Orders: -     Ambulatory referral to Dentistry  Breast cancer screening Orders: -     MM Digital Screening; Future   Return if symptoms worsen or fail to improve.     Holland Commons, NP-C [facility] [phone] 09/25/2014, 2:13 PM

## 2014-09-25 NOTE — Progress Notes (Signed)
New patient here to established care.Patient has left knee pain and she is requesting a dental referral and eye exam.

## 2014-09-25 NOTE — Patient Instructions (Signed)
Once the referrals have been place

## 2014-09-28 ENCOUNTER — Encounter: Payer: Self-pay | Admitting: Internal Medicine

## 2014-10-28 ENCOUNTER — Ambulatory Visit: Payer: Self-pay

## 2014-10-30 ENCOUNTER — Encounter: Payer: Self-pay | Admitting: Podiatry

## 2014-10-30 ENCOUNTER — Telehealth: Payer: Self-pay | Admitting: *Deleted

## 2014-10-30 ENCOUNTER — Ambulatory Visit: Payer: No Typology Code available for payment source

## 2014-10-30 ENCOUNTER — Ambulatory Visit (INDEPENDENT_AMBULATORY_CARE_PROVIDER_SITE_OTHER): Payer: No Typology Code available for payment source | Admitting: Podiatry

## 2014-10-30 VITALS — BP 143/91 | HR 60 | Resp 18

## 2014-10-30 DIAGNOSIS — R200 Anesthesia of skin: Secondary | ICD-10-CM

## 2014-10-30 DIAGNOSIS — M2010 Hallux valgus (acquired), unspecified foot: Secondary | ICD-10-CM

## 2014-10-30 DIAGNOSIS — R52 Pain, unspecified: Secondary | ICD-10-CM

## 2014-10-30 DIAGNOSIS — M2040 Other hammer toe(s) (acquired), unspecified foot: Secondary | ICD-10-CM

## 2014-10-30 DIAGNOSIS — R208 Other disturbances of skin sensation: Secondary | ICD-10-CM

## 2014-10-30 NOTE — Progress Notes (Deleted)
   Subjective:    Patient ID: Caitlyn Fields, female    DOB: 1950-06-20, 64 y.o.   MRN: [id number]  HPI I AM HAVING SOME ISSUES WITH NUMBNESS AND MAYBE SOME NEUROPATHY ON BOTH OF MY FEET     Review of Systems  All other systems reviewed and are negative.      Objective:   Physical Exam        Assessment & Plan:

## 2014-10-30 NOTE — Progress Notes (Signed)
Subjective:     Patient ID: Caitlyn Fields, female   DOB: Aug 10, 1950, 64 y.o.   MRN: [id number]  HPI Caitlyn Fields presents to the office today for numbness to both of her feet. She believes this has been ongoing for about 2 years and is maybe a "tiny bit worse". She also states that her toes have started to curl some. She believes her bunion is also been getting worse. Denies any history of injury or trauma. She was asking if this could be coming from her back although she does not have any back pain or issues. Denies any swelling or redness to her feet. She has a states her feet are cold all the time which has been ongoing she has a states that she is to sit in which she describes a "W" position and is concerned she may have injured a nerve when doing this. No other complaints at this time.   Review of Systems  All other systems reviewed and are negative.      Objective:   Physical Exam AAO x3, NAD DP/PT pulses palpable bilaterally, CRT less than 3 seconds Protective sensation grossly intact with Simms Weinstein monofilament, vibratory sensation intact, Achilles tendon reflex intact. She does have subjective numbness to both of her feet mostly from the midfoot to the toes.  There is hammertoe contractures identified bilaterally with mild HAV present. There is no pain or crepitation first MTPJ range of motion. No hypermobility is present. There is mild diffuse tenderness bilateral lower extremities. There is a decrease in medial arch height bilaterally. There is no areas of pinpoint bony tenderness or pain the vibratory sensation of bilateral lower extremity's. There is no palmar neuroma identified and there is no pain with medial lateral compression of the metatarsals. No areas of tenderness to bilateral lower extremities. MMT 5/5, ROM WNL.  No open lesions or pre-ulcerative lesions.  No overlying edema, erythema, increase in warmth to bilateral lower extremities.  No pain with calf  compression, swelling, warmth, erythema bilaterally.      Assessment:     64 year old female with bilateral foot numbness, hammertoe/bunion    Plan:     -X-rays were obtained and reviewed with the patient.  -Treatment options discussed including all alternatives, risks, and complications -I discussed likely etiology of her symptoms. I believe this is biomechanical in nature. She did not think this is what was causing her issue issue and would like to have her nerves and her circulation checked. I've ordered nerve conduction test as well as circulation studies to out other causes of the numbness to her feet although I do not believe that is coming from a vascular issue. If she is truly concerned about her back causing issues to her feet she is to follow-up with her primary care physician. -I discussed orthotics and offloading pads. She would like to have the studies performed first. -Follow-up at the studies are performed or sooner if any palms are to arise. Call with questions or concerns in the meantime.  Caitlyn Fields, DPM

## 2014-11-02 ENCOUNTER — Telehealth: Payer: Self-pay | Admitting: *Deleted

## 2014-11-02 DIAGNOSIS — R200 Anesthesia of skin: Secondary | ICD-10-CM

## 2014-11-02 NOTE — Telephone Encounter (Signed)
Entered in error

## 2014-11-03 ENCOUNTER — Telehealth: Payer: Self-pay | Admitting: *Deleted

## 2014-11-03 ENCOUNTER — Telehealth (HOSPITAL_COMMUNITY): Payer: Self-pay | Admitting: *Deleted

## 2014-11-03 NOTE — Telephone Encounter (Signed)
[facility] doesn't participate with [facility] "CAFA", was rescheduled with [facility].  Left message informing pt of the change to [facility] [phone].

## 2014-11-03 NOTE — Telephone Encounter (Signed)
Entered in error

## 2014-11-24 ENCOUNTER — Ambulatory Visit (HOSPITAL_COMMUNITY)
Admission: RE | Admit: 2014-11-24 | Discharge: 2014-11-24 | Disposition: A | Discharge status: Home / Self Care | Payer: Self-pay | Source: Ambulatory Visit | Attending: Podiatry | Admitting: Podiatry

## 2014-11-24 DIAGNOSIS — R200 Anesthesia of skin: Secondary | ICD-10-CM | POA: Insufficient documentation

## 2014-11-26 ENCOUNTER — Telehealth: Payer: Self-pay | Admitting: *Deleted

## 2014-11-26 NOTE — Telephone Encounter (Addendum)
Pt called states the neurologist she was referred to initially did not take the "orange card" and she was referred to [facility], and needs to be referred to [name] because they don't take the "orange card",but she does have a letter stating her visit will be cover 100%.  I left a message informing pt I was not certain what she was requesting, because I sent a referral to [facility] in July.

## 2014-12-25 ENCOUNTER — Telehealth: Payer: Self-pay | Admitting: *Deleted

## 2014-12-25 DIAGNOSIS — R200 Anesthesia of skin: Secondary | ICD-10-CM

## 2014-12-25 NOTE — Telephone Encounter (Addendum)
Pt states she needs to have a nerve test ordered the one ordered in July expired, because it was thought she did not have coverage.  Pt states she now has the letter that states her procedure is cover 100%.    Left message [phone] that orders were in again for [facility].

## 2015-01-05 ENCOUNTER — Telehealth: Payer: Self-pay | Admitting: Neurology

## 2015-01-05 DIAGNOSIS — R200 Anesthesia of skin: Secondary | ICD-10-CM

## 2015-01-05 NOTE — Telephone Encounter (Signed)
I called patient and informed her that I have not seen a fax from [facility] but that I would check on it and have someone call her with an appointment.

## 2015-01-05 NOTE — Telephone Encounter (Signed)
Pt called concerning the nerve conduction study fax from/[facility]/ call back @ /[phone]

## 2015-01-05 NOTE — Telephone Encounter (Signed)
Referral received.

## 2015-01-05 NOTE — Telephone Encounter (Signed)
Faxed orders to [facility].

## 2015-01-19 ENCOUNTER — Ambulatory Visit (INDEPENDENT_AMBULATORY_CARE_PROVIDER_SITE_OTHER): Payer: Self-pay | Admitting: Neurology

## 2015-01-19 DIAGNOSIS — R200 Anesthesia of skin: Secondary | ICD-10-CM

## 2015-01-19 DIAGNOSIS — G609 Hereditary and idiopathic neuropathy, unspecified: Secondary | ICD-10-CM

## 2015-01-19 NOTE — Procedures (Signed)
[facility]  [address] [place], [address]  [place], [place] [address] Tel: [phone] Fax:  [phone] Test Date:  65/02/2015  Patient: Caitlyn Fields DOB: 02-26-1951 Physician: Nita Sickle  Sex: Female Height:  Ref Phys: Dr Ardelle Anton  ID#: [id number] Temp: 33.6C Technician: Judie Petit. Dean   Patient Complaints: This is a 64 year old female presenting for evaluation of bilateral feet tingling and numbness.   NCV & EMG Findings: Extensive electrodiagnostic testing of the right lower extremity and additional studies of the left shows:  1. Bilateral sural and superficial peroneal sensory responses are absent. 2. Bilateral peroneal motor responses recording at the extensor digitorum brevis are reduced; however, motor responses within normal limits at the tibialis anterior. Bilateral tibial motor responses also reduced.  3. Chronic motor axon loss changes are seen affecting the muscles below the knee following a gradient pattern. There is no evidence of active denervation.  Impression: 1. The electrophysiologic findings are most consistent with a distal and symmetric sensorimotor polyneuropathy, axon loss in type, affecting the lower extremities. Overall, these findings are moderate to severe in degree electrically. 2. There is no evidence of a lumbosacral radiculopathy affecting the lower extremities.    _____________________________ Nita Sickle, D.O.    Nerve Conduction Studies Anti Sensory Summary Table   Stim Site NR Peak (ms) Norm Peak (ms) P-T Amp (V) Norm P-T Amp  Left Sup Peroneal Anti Sensory (Ant Lat Mall)  33.6C  12 cm NR  <4.6  >3  Right Sup Peroneal Anti Sensory (Ant Lat Mall)  33.6C  12 cm NR  <4.6  >3  Left Sural Anti Sensory (Lat Mall)  33.6C  Calf NR  <4.6  >3  Right Sural Anti Sensory (Lat Mall)  33.6C  Calf NR  <4.6  >3   Motor Summary Table   Stim Site NR Onset (ms) Norm Onset (ms) O-P Amp (mV) Norm O-P Amp Site1 Site2 Delta-0 (ms) Dist  (cm) Vel (m/s) Norm Vel (m/s)  Left Peroneal Motor (Ext Dig Brev)  33.6C  Ankle    5.1 <6.0 1.4 >2.5 B Fib Ankle 7.2 30.0 42 >40  B Fib    12.3  1.0  Poplt B Fib 2.3 10.0 43 >40  Poplt    14.6  0.9         Right Peroneal Motor (Ext Dig Brev)  33.6C  Ankle    4.1 <6.0 2.3 >2.5 B Fib Ankle 7.9 33.0 42 >40  B Fib    12.0  1.2  Poplt B Fib 2.0 10.0 50 >40  Poplt    14.0  1.2         Left Peroneal TA Motor (Tib Ant)  33.6C  Fib Head    2.2 <4.5 4.6 >3 Poplit Fib Head 1.9 10.0 53 >40  Poplit    4.1  4.4         Right Peroneal TA Motor (Tib Ant)  33.6C  Fib Head    2.2 <4.5 4.0 >3 Poplit Fib Head 1.8 10.0 56 >40  Poplit    4.0  3.9         Left Tibial Motor (Abd Hall Brev)  33.6C  Ankle    4.1 <6.0 3.0 >4 Knee Ankle 8.9 39.0 44 >40  Knee    13.0  0.4         Right Tibial Motor (Abd Hall Brev)  33.6C  Ankle    4.6 <6.0 1.2 >4 Knee Ankle 9.3 39.0 42 >40  Knee  13.9  0.2          EMG   Side Muscle Ins Act Fibs Psw Fasc Number Recrt Dur Dur. Amp Amp. Poly Poly. Comment  Right AntTibialis Nml Nml Nml Nml 1- Mod-R Some 1+ Some 1+ Nml Nml N/A  Right Gastroc Nml Nml Nml Nml 1- Mod-R Few 1+ Some 1+ Nml Nml N/A  Right Flex Dig Long Nml Nml Nml Nml 1- Rapid Some 1+ Some 1+ Nml Nml N/A  Right RectFemoris Nml Nml Nml Nml Nml Nml Nml Nml Nml Nml Nml Nml N/A  Right BicepsFemS Nml Nml Nml Nml Nml Nml Nml Nml Nml Nml Nml Nml N/A  Right GluteusMed Nml Nml Nml Nml Nml Nml Nml Nml Nml Nml Nml Nml N/A  Left GluteusMed Nml Nml Nml Nml Nml Nml Nml Nml Nml Nml Nml Nml N/A  Left AntTibialis Nml Nml Nml Nml 1- Mod-R Some 1+ Some 1+ Nml Nml N/A  Left Gastroc Nml Nml Nml Nml 1- Mod-R Some 1+ Few 1+ Nml Nml N/A  Left Flex Dig Long Nml Nml Nml Nml 1- Mod-R Some 1+ Some 1+ Nml Nml N/A      Waveforms:

## 2015-01-25 ENCOUNTER — Telehealth: Payer: Self-pay | Admitting: *Deleted

## 2015-01-25 DIAGNOSIS — R200 Anesthesia of skin: Secondary | ICD-10-CM

## 2015-01-25 NOTE — Telephone Encounter (Addendum)
-----   Message from Vivi BarrackMatthew R Wagoner, DPM sent at 01/25/2015  8:10 AM EDT ----- Can you please schedule a neurology appt.  Faxed referral, pt data and LOV note. ----- Message -----    From: [place] Chardonika K Patel, DO    Sent: 01/19/2015   4:01 PM      To: Vivi BarrackMatthew R Wagoner, DPM

## 2015-02-03 ENCOUNTER — Ambulatory Visit: Payer: Self-pay

## 2015-02-05 ENCOUNTER — Ambulatory Visit: Payer: Self-pay | Attending: Internal Medicine

## 2015-03-08 ENCOUNTER — Encounter: Payer: Self-pay | Admitting: Neurology

## 2015-03-08 ENCOUNTER — Other Ambulatory Visit (INDEPENDENT_AMBULATORY_CARE_PROVIDER_SITE_OTHER): Payer: Self-pay

## 2015-03-08 ENCOUNTER — Ambulatory Visit (INDEPENDENT_AMBULATORY_CARE_PROVIDER_SITE_OTHER): Payer: Self-pay | Admitting: Neurology

## 2015-03-08 VITALS — BP 110/70 | HR 72 | Ht 75.0 in | Wt 134.5 lb

## 2015-03-08 DIAGNOSIS — G609 Hereditary and idiopathic neuropathy, unspecified: Secondary | ICD-10-CM | POA: Insufficient documentation

## 2015-03-08 LAB — TSH: TSH: 1.58 u[IU]/mL (ref 0.35–4.50)

## 2015-03-08 LAB — VITAMIN B12: Vitamin B-12: 463 pg/mL (ref 211–911)

## 2015-03-08 NOTE — Progress Notes (Signed)
[facility] Clinic Note - Initial Visit   Date: 03/08/2015  Caitlyn Fields MRN: [id number] DOB: Apr 02, 1951   Dear Dr. Ardelle Anton:  Thank you for your kind referral of Caitlyn Fields for consultation of bilateral feet paresthesias. Although her history is well known to you, please allow [place] to reiterate it for the purpose of our medical record. The patient was accompanied to the clinic by self.    History of Present Illness: Caitlyn Fields is a 64 y.o. right-handed Caucasian female with depression presenting for evaluation of peripheral neuropathy.    Starting around in her 30s, she began experiencing mild numbness over the metatarsal area over the feet bilaterally.  Over the recent years, the numbness has involved greater area of her foot, but still is below the ankles bilaterally.  She also complains of intermittent tingling of the feet over the past two years.  She is unable to flex her toes and has has bilateral hammer toes.  No family history of neuropathy.  She is more comfortable in smaller goes and reports to tripping occasionally depending on the shoes she is wearing.  She denies any paresthesias of the hands.  Her EMG which I performed was consistent with a sensorimotor polyneuropathy.  She is here to discuss these results.  In her 45s, she drank heavily and started reducing her intake in her 30s.  No heavy drinking since then.  No history of diabetes or thyroid disease.  No family history of neuromuscular disease.   Out-side paper records, electronic medical record, and images have been reviewed where available and summarized as:  Lab Results  Component Value Date   TSH 3.208 09/27/2009   ABI of the lower extremities 11/25/2014:  Normal  NCS/EMG of the lower extremities 01/19/2015: 1. The electrophysiologic findings are most consistent with a distal and symmetric sensorimotor polyneuropathy, axon loss in type, affecting the lower extremities.  Overall, these findings are moderate to severe in degree electrically. 2. There is no evidence of a lumbosacral radiculopathy affecting the lower extremities.  CT head 06/12/2006:  negative  Lab Results  Component Value Date   TSH 3.208 09/27/2009     Past Medical History  Diagnosis Date  . Depression   . OZHYQMVH(846.9)     Past Surgical History  Procedure Laterality Date  . Cesarean section       Medications:  Outpatient Encounter Prescriptions as of 03/08/2015  Medication Sig  . FLUoxetine (PROZAC) 10 MG tablet Take 10 mg by mouth daily.    Marland Kitchen omeprazole (PRILOSEC) 20 MG capsule Take 1 capsule (20 mg total) by mouth daily. Daily for 2 weeks, then as needed.  . [DISCONTINUED] nabumetone (RELAFEN) 500 MG tablet Take 500 mg by mouth 2 (two) times daily.     No facility-administered encounter medications on file as of 03/08/2015.     Allergies:  Allergies  Allergen Reactions  . Sulfa Antibiotics Itching    Family History: Family History  Problem Relation Age of Onset  . Depression Mother   . Lung cancer Mother     Deceased, 31  . Depression Brother   . Heart murmur Father   . Alcoholism Father   . Heart attack Father     Deceased, 46  . Healthy Brother   . Depression Brother   . Healthy Son     Twins    Social History: Social History  Substance Use Topics  . Smoking status: Former Smoker -- 1.00 packs/day for 6 years  Types: Cigarettes  . Smokeless tobacco: Never Used  . Alcohol Use: No   Social History   Social History Narrative   Lives alone with pets in a one story home.  She has twin sons.     Works helping to clean houses.    Review of Systems:  CONSTITUTIONAL: No fevers, chills, night sweats, or weight loss.   EYES: No visual changes or eye pain ENT: No hearing changes.  No history of nose bleeds.   RESPIRATORY: No cough, wheezing and shortness of breath.   CARDIOVASCULAR: Negative for chest pain, and palpitations.   GI: Negative for  abdominal discomfort, blood in stools or black stools.  No recent change in bowel habits.   GU:  No history of incontinence.   MUSCLOSKELETAL: No history of joint pain or swelling.  No myalgias.   SKIN: Negative for lesions, rash, and itching.   HEMATOLOGY/ONCOLOGY: Negative for prolonged bleeding, bruising easily, and swollen nodes.  No history of cancer.   ENDOCRINE: Negative for cold or heat intolerance, polydipsia or goiter.   PSYCH:  +depression or anxiety symptoms.   NEURO: As Above.   Vital Signs:  BP 110/70 mmHg  Pulse 72  Ht  (1.905 m)  Wt 134 lb 8 oz (61.009 kg)  BMI 16.81 kg/m2  SpO2 99% Pain Scale: 0 on a scale of 0-10   General Medical Exam:   General:  Well appearing, comfortable.   Eyes/ENT: see cranial nerve examination.   Neck: No masses appreciated.  Full range of motion without tenderness.  No carotid bruits. Respiratory:  Clear to auscultation, good air entry bilaterally.   Cardiac:  Regular rate and rhythm, no murmur.   Extremities:  Mild hammer toes of the 3-5 digits bilaterally Skin:  No rashes or lesions.  Neurological Exam: MENTAL STATUS including orientation to time, place, person, recent and remote memory, attention span and concentration, language, and fund of knowledge is normal.  Speech is not dysarthric.  CRANIAL NERVES: II:  No visual field defects.  Unremarkable fundi.   III-IV-VI: Pupils equal round and reactive to light.  Normal conjugate, extra-ocular eye movements in all directions of gaze.  No nystagmus.  No ptosis.   V:  Normal facial sensation.    VII:  Normal facial symmetry and movements.  VIII:  Normal hearing and vestibular function.   IX-X:  Normal palatal movement.   XI:  Normal shoulder shrug and head rotation.   XII:  Normal tongue strength and range of motion, no deviation or fasciculation.  MOTOR:  No atrophy, fasciculations or abnormal movements.  No pronator drift.  Tone is normal.    Right Upper Extremity:    Left  Upper Extremity:    Deltoid  5/5   Deltoid  5/5   Biceps  5/5   Biceps  5/5   Triceps  5/5   Triceps  5/5   Wrist extensors  5/5   Wrist extensors  5/5   Wrist flexors  5/5   Wrist flexors  5/5   Finger extensors  5/5   Finger extensors  5/5   Finger flexors  5/5   Finger flexors  5/5   Dorsal interossei  5/5   Dorsal interossei  5/5   Abductor pollicis  5/5   Abductor pollicis  5/5   Tone (Ashworth scale)  0  Tone (Ashworth scale)  0   Right Lower Extremity:    Left Lower Extremity:    Hip flexors  5/5  Hip flexors  5/5   Hip extensors  5/5   Hip extensors  5/5   Knee flexors  5/5   Knee flexors  5/5   Knee extensors  5/5   Knee extensors  5/5   Dorsiflexors  5/5   Dorsiflexors  5/5   Plantarflexors  5/5   Plantarflexors  5/5   Toe extensors  5/5   Toe extensors  5/5   Toe flexors  5/5   Toe flexors  5/5   Tone (Ashworth scale)  0  Tone (Ashworth scale)  0   MSRs:  Right                                                                 Left brachioradialis 2+  brachioradialis 2+  biceps 2+  biceps 2+  triceps 2+  triceps 2+  patellar 3+  patellar 3++  ankle jerk 0  ankle jerk 0  Hoffman no  Hoffman no  plantar response mute  plantar response mute   SENSORY:  Reduced vibration at ankles and absent at great toe.  Temperature also reduced over the dorsum of the feet.  There is mild sway with Rhomberg testing.    COORDINATION/GAIT: Normal finger-to- nose-finger and heel-to-shin.  Intact rapid alternating movements bilaterally.  Gait narrow based and stable. Stressed gait intact.  She is unsteady with tandem gait.   IMPRESSION: Caitlyn Fields is a 64 year-old female referred for evaluation of bilateral feet paresthesias.  Her neurological examination and electrodiagnostic testing is consistent with a distal predominant large fiber peripheral neuropathy. I had extensive discussion with the patient regarding the pathogenesis, etiology, management, and natural course of neuropathy.  Neuropathy tends to be slowly progressive, especially if a treatable etiology is not identified.  I would like to test for treatable causes of neuropathy. I discussed that in the vast majority of cases, despite checking for reversible causes, we are unable to find the underlying etiology and management is symptomatic.  Even though symptoms started in her 30s and there is mild hammer toes, there is evidence of hereditary neuropathy based on NCS or family history.  She has many questions which I answered to the best of my ability.  PLAN/RECOMMENDATIONS:  1.  Check 2hr glucose tolerance testing, vitamin B12, TSH, copper, zinc, SPEP with IFE 2.  Literature provided on fall precautions.   3.  PT for gait training declined at this time  Return to clinic as needed.   The duration of this appointment visit was 45 minutes of face-to-face time with the patient.  Greater than 50% of this time was spent in counseling, explanation of diagnosis, planning of further management, and coordination of care.   Thank you for allowing me to participate in patient's care.  If I can answer any additional questions, I would be pleased to do so.    Sincerely,    Donika K. Allena KatzPatel, DO

## 2015-03-08 NOTE — Patient Instructions (Addendum)
1.  Check blood work.  We will call you with the results of your testing.  2.  Continue to stay active and when able, you can try tai chi or yoga for balance training 3.  Please call our office if you wish to start balance training with a physical therapist 4.  Return to clinic as needed   [facility]  Preventing Falls in the [place] are common, often dreaded events in the lives of older people. Aside from the obvious injuries and even death that may result, falls can cause wide-ranging consequences including loss of independence, mental decline, decreased activity, and mobility. Younger people are also at risk of falling, especially those with chronic illnesses and fatigue.  Ways to reduce the risk for falling:  * Examine diet and medications. Warm foods and alcohol dilate blood vessels, which can lead to dizziness when standing. Sleep aids, antidepressants, and pain medications can also increase the likelihood of a fall.  * Get a vison exam. Poor vision, cataracts, and glaucoma increase the chances of falling.  * Check foot gear. Shoes should fit snugly and have a sturdy, nonskid sole and broad, low heel.  * Participate in a physician-approved exercise program to build and maintain muscle strength and improve balance and coordination.  * Increase vitamin D intake. Vitamin D improves muscle strength and increases the amount of calcium the body is able to absorb and deposit in bones.  How to prevent falls from common hazards:  * Floors - Remove all loose wires, cords, and throw rugs. Minimize clutter. Make sure rugs are anchored and smooth. Keep furniture in its usual place.  * Chairs - Use chairs with straight backs, armrests, and firm seats. Add firm cushions to existing pieces to add height.  * Bathroom - Install grab bars and non-skid tape in the tub or shower. Use a bathtub transfer bench or a shower chair with a back support. Use an elevated toilet seat and/or safety rails to  assist standing from a low surface. Do not use towel racks or bathroom tissue holders to help you stand.  * Lighting - Make sure halls, stairways, and entrances are well-lit. Install a night light in your bathroom or hallway. Make sure there is a light switch at the top and bottom of the staircase. Turn lights on if you get up in the middle of the night. Make sure lamps or light switches are within reach of the bed if you have to get up during the night.  * Kitchen - Install non-skid rubber mats near the sink and stove. Clean spills immediately. Store frequently used utensils, pots, and pans between waist and eye level. This helps prevent reaching and bending. Sit when getting things out of the lower cupboards.  * Living room / Bernice furniture with wide spaces in between, giving enough room to move around. Establish a route through the living room that gives you something to hold onto as you walk.  * Stairs - Make sure treads, rails, and rugs are secure. Install a rail on both sides of the stairs. If stairs are a threat, it might be helpful to arrange most of your activities on the lower level to reduce the number of times you must climb the stairs.  * Entrances and doorways - Install metal handles on the walls adjacent to the doorknobs of all doors to make it more secure as you travel through the doorway.  Tips for maintaining balance:  *  Keep at least one hand free at all times Try using a backpack or fanny pack to hold things rather than carrying them in your hands. Never carry objects in both hands when walking as this interferes with keeping your balance.  * Attempt to swing both arms from front to back while walking. This might require a conscious effort if Parkinson's disease has diminished your movement. It will, however, help you to maintain balance and posture, and reduce fatigue.  * Consciously lift your feet off the ground when walking. Shuffling and dragging of the feet is a common  culprit in losing your balance.  * When trying to navigate turns, use a "U" technique of facing forward and making a wide turn, rather than pivoting sharply.  * Try to stand with your feet shoulder-length apart. When your feet are close together for any length of time, you increase your risk of losing your balance and falling.  * Do one thing at a time. Do not try to walk and accomplish another task, such as reading or looking around. The decrease in your automatic reflexes complicates motor function, so the less distraction, the better.  * Do not wear rubber or gripping soled shoes, they might "catch" on the floor and cause tripping.  * Move slowly when changing positions. Use deliberate, concentrated movements and, if needed, use a grab bar or walking aid. Count fifteen (15) seconds after standing to begin walking.  * If balance is a continuous problem, you might want to consider a walking aid such as a cane, walking stick, or walker. Once you have mastered walking with help, you may be ready to try it again on your own.  This information is provided by [facility] and is not intended to replace the medical advice of your physician or other health care providers. Please consult your physician or other health care providers for advice regarding your specific medical condition.

## 2015-03-10 LAB — SPEP & IFE WITH QIG
Albumin ELP: 4 g/dL (ref 3.8–4.8)
Alpha-1-Globulin: 0.2 g/dL (ref 0.2–0.3)
Alpha-2-Globulin: 0.6 g/dL (ref 0.5–0.9)
Beta 2: 0.2 g/dL (ref 0.2–0.5)
Beta Globulin: 0.4 g/dL (ref 0.4–0.6)
GAMMA GLOBULIN: 0.8 g/dL (ref 0.8–1.7)
IGG (IMMUNOGLOBIN G), SERUM: 925 mg/dL (ref 690–1700)
IgA: 132 mg/dL (ref 69–380)
IgM, Serum: 88 mg/dL (ref 52–322)
TOTAL PROTEIN, SERUM ELECTROPHOR: 6.2 g/dL (ref 6.1–8.1)

## 2015-03-11 LAB — ZINC: Zinc: 66 ug/dL (ref 60–130)

## 2015-03-11 LAB — COPPER, SERUM: Copper: 96 ug/dL (ref 70–175)

## 2015-06-17 ENCOUNTER — Other Ambulatory Visit: Payer: Self-pay | Admitting: Family Medicine

## 2015-06-30 ENCOUNTER — Ambulatory Visit: Payer: Self-pay

## 2015-07-02 ENCOUNTER — Other Ambulatory Visit: Payer: Self-pay | Admitting: Internal Medicine

## 2015-07-02 DIAGNOSIS — Z1231 Encounter for screening mammogram for malignant neoplasm of breast: Secondary | ICD-10-CM

## 2015-07-05 ENCOUNTER — Ambulatory Visit
Admission: RE | Admit: 2015-07-05 | Discharge: 2015-07-05 | Disposition: A | Discharge status: Home / Self Care | Payer: Medicare Other | Source: Ambulatory Visit | Attending: Internal Medicine | Admitting: Internal Medicine

## 2015-07-05 DIAGNOSIS — Z1231 Encounter for screening mammogram for malignant neoplasm of breast: Secondary | ICD-10-CM

## 2015-07-07 ENCOUNTER — Telehealth: Payer: Self-pay

## 2015-07-07 NOTE — Telephone Encounter (Signed)
-----   Message from Ambrose FinlandValerie A Keck, NP sent at 07/06/2015 12:17 PM EDT ----- Mammogram is negative for malignancies. Will repeat in one year

## 2015-07-07 NOTE — Telephone Encounter (Signed)
Spoke with patient this am and she is aware Her mammogram was negative for malignancies

## 2015-12-08 ENCOUNTER — Other Ambulatory Visit: Payer: Self-pay | Admitting: Family Medicine

## 2015-12-08 ENCOUNTER — Other Ambulatory Visit (HOSPITAL_COMMUNITY)
Admission: RE | Admit: 2015-12-08 | Discharge: 2015-12-08 | Disposition: A | Discharge status: Home / Self Care | Payer: Medicare Other | Source: Ambulatory Visit | Attending: Family Medicine | Admitting: Family Medicine

## 2015-12-08 DIAGNOSIS — Z124 Encounter for screening for malignant neoplasm of cervix: Secondary | ICD-10-CM | POA: Insufficient documentation

## 2015-12-08 DIAGNOSIS — Z01419 Encounter for gynecological examination (general) (routine) without abnormal findings: Secondary | ICD-10-CM | POA: Diagnosis present

## 2015-12-10 LAB — CYTOLOGY - PAP

## 2015-12-29 ENCOUNTER — Other Ambulatory Visit (INDEPENDENT_AMBULATORY_CARE_PROVIDER_SITE_OTHER): Payer: Medicare Other

## 2015-12-29 ENCOUNTER — Encounter: Payer: Self-pay | Admitting: *Deleted

## 2015-12-29 DIAGNOSIS — G609 Hereditary and idiopathic neuropathy, unspecified: Secondary | ICD-10-CM | POA: Diagnosis not present

## 2015-12-29 LAB — GLUCOSE TOLERANCE, 2 HOURS
Glucose, 1 Hour GTT: 111 mg/dL
Glucose, 2 hour: 75 mg/dL
Glucose, Fasting: 93 mg/dL (ref 70–99)

## 2016-10-01 ENCOUNTER — Ambulatory Visit (HOSPITAL_COMMUNITY)
Admission: EM | Admit: 2016-10-01 | Discharge: 2016-10-01 | Disposition: A | Discharge status: Other Acute Inpatient Hospital | Payer: Medicare Other

## 2017-05-14 ENCOUNTER — Other Ambulatory Visit: Payer: Self-pay

## 2017-05-14 ENCOUNTER — Encounter (HOSPITAL_COMMUNITY): Payer: Self-pay

## 2017-05-14 ENCOUNTER — Emergency Department (HOSPITAL_COMMUNITY)
Admission: EM | Admit: 2017-05-14 | Discharge: 2017-05-14 | Disposition: A | Discharge status: Home / Self Care | Payer: Medicare Other | Attending: Emergency Medicine | Admitting: Emergency Medicine

## 2017-05-14 DIAGNOSIS — H53411 Scotoma involving central area, right eye: Secondary | ICD-10-CM | POA: Diagnosis not present

## 2017-05-14 DIAGNOSIS — H538 Other visual disturbances: Secondary | ICD-10-CM | POA: Diagnosis present

## 2017-05-14 DIAGNOSIS — Z87891 Personal history of nicotine dependence: Secondary | ICD-10-CM | POA: Diagnosis not present

## 2017-05-14 NOTE — ED Notes (Signed)
Bed: WA15 Expected date:  Expected time:  Means of arrival:  Comments: 

## 2017-05-14 NOTE — ED Triage Notes (Signed)
States about 2000 pm tonight saw flashes of light in right eye worse with movement and eye movement also.

## 2017-05-14 NOTE — Discharge Instructions (Signed)
Follow-up with ophthalmology tomorrow.  Dr. Dione BoozeGroat is aware of your situation and wants you seen then.  You can either call the office in the morning to arrange a time or go there in the morning and they will work you in.

## 2017-05-14 NOTE — ED Provider Notes (Signed)
[facility]-EMERGENCY DEPT Provider Note   CSN: [id number] Arrival date & time: 05/14/17  0006     History   Chief Complaint Chief Complaint  Patient presents with  . Eye Problem    HPI Caitlyn Fields is a 67 y.o. female.  Patient is a 67 year old female with history of depression and headaches presenting for evaluation of seeing lights in her right visual field.  This started after rubbing her eye earlier this evening.  She denies any loss of vision.  She denies any eye pain.  Her symptoms have spontaneously improved and nearly completely have resolved.   The history is provided by the patient.  Eye Problem   This is a new problem. The problem occurs constantly. There is a problem in the right eye. The pain is moderate. Pertinent negatives include no blurred vision and no decreased vision.    Past Medical History:  Diagnosis Date  . Depression   . WUJWJXBJ(478.2)     Patient Active Problem List   Diagnosis Date Noted  . Hereditary and idiopathic peripheral neuropathy 03/08/2015    Past Surgical History:  Procedure Laterality Date  . CESAREAN SECTION      OB History    No data available       Home Medications    Prior to Admission medications   Medication Sig Start Date End Date Taking? Authorizing Provider  FLUoxetine (PROZAC) 10 MG tablet Take 30 mg by mouth daily after breakfast.    Yes [provider]  loratadine (CLARITIN) 10 MG tablet Take 10 mg by mouth daily as needed for allergies.   Yes [provider]  omeprazole (PRILOSEC) 20 MG capsule Take 1 capsule (20 mg total) by mouth daily. Daily for 2 weeks, then as needed. Patient taking differently: Take 20 mg by mouth daily as needed (reflux, indigestion).  08/03/14  Yes Charm Rings, MD    Family History Family History  Problem Relation Age of Onset  . Heart murmur Father   . Alcoholism Father   . Heart attack Father        Deceased, 50  . Depression  Mother   . Lung cancer Mother        Deceased, 25  . Depression Brother   . Healthy Brother   . Depression Brother   . Healthy Son        Twins    Social History Social History   Tobacco Use  . Smoking status: Former Smoker    Packs/day: 1.00    Years: 6.00    Pack years: 6.00    Types: Cigarettes  . Smokeless tobacco: Never Used  Substance Use Topics  . Alcohol use: No    Alcohol/week: 0.0 oz  . Drug use: No     Allergies   Sulfa antibiotics   Review of Systems Review of Systems  Eyes: Negative for blurred vision.  All other systems reviewed and are negative.    Physical Exam Updated Vital Signs BP (!) 142/95 (BP Location: Left Arm)   Pulse 77   Temp 98.2 F (36.8 C) (Oral)   Resp 20   Ht 5\' 4"  (1.626 m)   Wt 63.5 kg (140 lb)   SpO2 95%   BMI 24.03 kg/m   Physical Exam  Constitutional: She is oriented to person, place, and time. She appears well-developed and well-nourished. No distress.  HENT:  Head: Normocephalic and atraumatic.  Eyes: EOM are normal. Pupils are equal, round, and reactive  to light.  Funduscopic examination reveals no obvious abnormality.  I see no evidence for retinal detachment or other abnormality of the fundus.  Neck: Normal range of motion. Neck supple.  Pulmonary/Chest: Effort normal.  Neurological: She is alert and oriented to person, place, and time.  Skin: Skin is warm and dry. She is not diaphoretic.  Nursing note and vitals reviewed.    ED Treatments / Results  Labs (all labs ordered are listed, but only abnormal results are displayed) Labs Reviewed - No data to display  EKG  EKG Interpretation None       Radiology No results found.  Procedures Procedures (including critical care time)  Medications Ordered in ED Medications - No data to display   Initial Impression / Assessment and Plan / ED Course  I have reviewed the triage vital signs and the nursing notes.  Pertinent labs & imaging results that  were available during my care of the patient were reviewed by me and considered in my medical decision making (see chart for details).  Patient presents with scotomata, the etiology of which I am uncertain.  Her eye exam here is essentially unremarkable.  I have discussed the case with Dr. Dione BoozeGroat from ophthalmology who will have the patient seen in the office tomorrow for a formal eye exam.  Final Clinical Impressions(s) / ED Diagnoses   Final diagnoses:  None    ED Discharge Orders    None       Geoffery Lyonselo, Keilani Terrance, MD 05/14/17 [phone]

## 2017-06-21 ENCOUNTER — Other Ambulatory Visit: Payer: Self-pay | Admitting: Family Medicine

## 2017-06-21 DIAGNOSIS — Z1231 Encounter for screening mammogram for malignant neoplasm of breast: Secondary | ICD-10-CM

## 2017-07-09 ENCOUNTER — Ambulatory Visit
Admission: RE | Admit: 2017-07-09 | Discharge: 2017-07-09 | Disposition: A | Discharge status: Home / Self Care | Payer: Medicare Other | Source: Ambulatory Visit | Attending: Family Medicine | Admitting: Family Medicine

## 2017-07-09 DIAGNOSIS — Z1231 Encounter for screening mammogram for malignant neoplasm of breast: Secondary | ICD-10-CM

## 2017-11-16 ENCOUNTER — Encounter: Payer: Self-pay | Admitting: Physical Therapy

## 2017-11-16 ENCOUNTER — Ambulatory Visit: Payer: Medicare Other | Attending: Orthopedic Surgery | Admitting: Physical Therapy

## 2017-11-16 DIAGNOSIS — R29898 Other symptoms and signs involving the musculoskeletal system: Secondary | ICD-10-CM

## 2017-11-16 DIAGNOSIS — M6281 Muscle weakness (generalized): Secondary | ICD-10-CM

## 2017-11-16 DIAGNOSIS — M25552 Pain in left hip: Secondary | ICD-10-CM | POA: Diagnosis not present

## 2017-11-16 NOTE — Patient Instructions (Signed)

## 2017-11-16 NOTE — Therapy (Signed)
[facility] [address] [place], [place], [address] Phone: [phone]   Fax:  [phone]  Physical Therapy Evaluation  Patient Details  Name: Caitlyn DameJulia A Fields MRN: [id number] Date of Birth: 03/24/1951 Referring Provider: Dr Gean BirchwoodFrank Rowan   Encounter Date: 11/16/2017  PT End of Session - 11/16/17 0935    Visit Number  1    Number of Visits  8    Date for PT Re-Evaluation  12/14/17    PT Start Time  0935    PT Stop Time  1031    PT Time Calculation (min)  56 min    Activity Tolerance  Other (comment)   pt with multiple questions and concerns about how to safely perform activities      Past Medical History:  Diagnosis Date  . Depression   . LKGMWNUU(725.3Headache(784.0)     Past Surgical History:  Procedure Laterality Date  . CESAREAN SECTION      There were no vitals filed for this visit.   Subjective Assessment - 11/16/17 0938    Subjective  Pt reports she developed Lt hip pain about 5 wks ago. Has trouble lifting and rotating the Lt hip. She had a period of increased activity with working on a house     Pertinent History  h/o knee meniscus tear Lt     How long can you sit comfortably?  no limitations    How long can you walk comfortably?  getting some better however limited to 0.50 miles of walking    Patient Stated Goals  return to walking program and get in better shape all around    Currently in Pain?  No/denies   increased with walking and lying on left side 3/10        [name] - 11/16/17 0001      Assessment   Medical Diagnosis  Lt hip bursitis    Referring Provider  Dr Gean BirchwoodFrank Rowan    Onset Date/Surgical Date  09/16/17    Hand Dominance  Right    Next MD Visit  12/14/17    Prior Therapy  not for this      Precautions   Precautions  None    Precaution Comments  osteopenia      Restrictions   Weight Bearing Restrictions  No      Balance Screen   Has the patient fallen in the past 6  months  Yes    How many times?  1   tripped on blankets for dogs    Has the patient had a decrease in activity level because of a fear of falling?   No    Is the patient reluctant to leave their home because of a fear of falling?   No      Home Public house managernvironment   Living Environment  Private residence    Living Arrangements  Alone      Prior Function   Level of Independence  Independent    Vocation  Retired    Leisure  walk      Observation/Other Assessments   Focus on Therapeutic Outcomes (FOTO)   52% limited      Functional Tests   Functional tests  Squat;Single leg stance      Squat   Comments  wt shift to the Rt ,bilat LE adduction      Single Leg Stance   Comments  15 sec + bilat      Posture/Postural Control  Posture/Postural Control  Postural limitations    Postural Limitations  Flexed trunk;Decreased lumbar lordosis;Decreased thoracic kyphosis      ROM / Strength   AROM / PROM / Strength  AROM;Strength      AROM   AROM Assessment Site  Hip;Lumbar    Right/Left Hip  --   WNL   Lumbar Flexion  to mid shin    Lumbar Extension  WNL    Lumbar - Right Rotation  WNL    Lumbar - Left Rotation  WNL      Strength   Strength Assessment Site  Hip;Knee;Ankle;Lumbar    Right/Left Hip  Left   Rt 5/5   Left Hip Flexion  --   5-/5   Left Hip Extension  4/5    Left Hip ABduction  4-/5    Right/Left Knee  --   WNL   Right/Left Ankle  --   WNL   Lumbar Extension  --   strong multifidi contraction     Flexibility   Soft Tissue Assessment /Muscle Length  yes    Hamstrings  supine SLR Rt 70, Lt 72    Quadriceps  prone quad flex heels to buttocks bilat     ITB  tight bilat      Palpation   Palpation comment  point tender posterior Lt greater trochanter                Objective measurements completed on examination: See above findings.      [facility] Adult PT Treatment/Exercise - 11/16/17 0001      Exercises   Exercises  Knee/Hip      Knee/Hip Exercises:  Stretches   ITB Stretch  Left;1 rep;60 seconds   cross body with strap     Knee/Hip Exercises: Supine   Bridges  Strengthening;Both;10 reps   with red band around knees, VC for form     Knee/Hip Exercises: Sidelying   Clams  10 reps bilat with red band and VC for form      Modalities   Modalities  Iontophoresis      Iontophoresis   Type of Iontophoresis  Dexamethasone    Location  Lt posterior greater troch    Dose  1.0cc    Time  patch, 6 hrs               PT Short Term Goals - 11/16/17 1215      PT SHORT TERM GOAL #1   Title  I with initial HEP     Time  1    Period  Weeks    Status  New    Target Date  11/23/17        PT Long Term Goals - 11/16/17 1215      PT LONG TERM GOAL #1   Title  I with advanced HEP to include a walking program and gym equipment - pt has silver sneakers    Time  4    Period  Weeks    Status  New    Target Date  12/14/17      PT LONG TERM GOAL #2   Title  improve Lt hip strength =/> 5-/5 to allow her to ambulate up/down stairs with minimal to no pain    Time  4    Period  Weeks    Status  New    Target Date  12/14/17      PT LONG TERM GOAL #3   Title  improve FOTO =/<  39% limited     Time  4    Period  Weeks    Status  New    Target Date  12/14/17      PT LONG TERM GOAL #4   Title  demo safe movement patterns with squating and kneeling to decrease risk of reinjury     Time  4    Period  Weeks    Status  New    Target Date  12/14/17             Plan - 11/16/17 1211    Clinical Impression Statement  67 yo female with reports of multiple areas of her body with pain.  Her referral is for her Lt hip.  She does have weakness in the Lt hip, tighteness in her hamstrings and ITB, difficulty and pain with functional activities such as getting on the ground to care for her animals.  She was walking for exercise and is now limited due to her pain. [place] would benefit from PT to assist with restoring her PLOF and  decrease pain    History and Personal Factors relevant to plan of care:  osteoporosis, knee meniscus issues, depression    Clinical Presentation  Evolving    Clinical Decision Making  Moderate    Rehab Potential  Good    PT Frequency  2x / week    PT Duration  4 weeks    PT Treatment/Interventions  Iontophoresis 4mg /ml Dexamethasone;Neuromuscular re-education;Dry needling;Manual techniques;Moist Heat;Ultrasound;Patient/family education;Taping;Therapeutic exercise;Cryotherapy;Electrical Stimulation;Passive range of motion    PT Next Visit Plan  assess response to ionto, hip strengthening, instruction in safe ways to squat/pick things up. modalites PRN    Consulted and Agree with Plan of Care  Patient       Patient will benefit from skilled therapeutic intervention in order to improve the following deficits and impairments:  Pain, Decreased activity tolerance, Decreased strength, Impaired perceived functional ability, Difficulty walking, Impaired flexibility  Visit Diagnosis: Pain in left hip - Plan: PT plan of care cert/re-cert  Muscle weakness (generalized) - Plan: PT plan of care cert/re-cert  Other symptoms and signs involving the musculoskeletal system - Plan: PT plan of care cert/re-cert     Problem List Patient Active Problem List   Diagnosis Date Noted  . Hereditary and idiopathic peripheral neuropathy 03/08/2015    Roderic Scarce PT  11/16/2017, 12:20 PM  [facility] [address] [place], [place], [address] Phone: [phone]   Fax:  [phone]  Name: YINA RIVIERE MRN: [id number] Date of Birth: 11-Mar-1951

## 2017-11-20 ENCOUNTER — Ambulatory Visit: Payer: Medicare Other | Admitting: Physical Therapy

## 2017-11-20 ENCOUNTER — Encounter: Payer: Self-pay | Admitting: Physical Therapy

## 2017-11-20 DIAGNOSIS — M25552 Pain in left hip: Secondary | ICD-10-CM

## 2017-11-20 DIAGNOSIS — M6281 Muscle weakness (generalized): Secondary | ICD-10-CM

## 2017-11-20 NOTE — Therapy (Signed)
[facility] [address] [place], [place], [address] Phone: [phone]   Fax:  [phone]  Physical Therapy Treatment  Patient Details  Name: Caitlyn DameJulia A Burleson MRN: [id number] Date of Birth: 02/18/1951 Referring Provider: Dr Gean BirchwoodFrank Rowan   Encounter Date: 11/20/2017  PT End of Session - 11/20/17 0957    Visit Number  2    Number of Visits  8    Date for PT Re-Evaluation  12/14/17    PT Start Time  0930    PT Stop Time  1020    PT Time Calculation (min)  50 min       Past Medical History:  Diagnosis Date  . Depression   . ONGEXBMW(413.2Headache(784.0)     Past Surgical History:  Procedure Laterality Date  . CESAREAN SECTION      There were no vitals filed for this visit.  Subjective Assessment - 11/20/17 0938    Subjective  did HEP 1 time, " can we review?"    Currently in Pain?  No/denies                       [facility]Ex[facility] - 11/20/17 0001      Exercises   Exercises  Lumbar;Knee/Hip      Lumbar Exercises: Standing   Other Standing Lumbar Exercises  hip 3 way red tband 10 each leg      Lumbar Exercises: Supine   Bridge with Ball Squeeze  Compliant;15 reps    Straight Leg Raise  10 reps   with abd   Other Supine Lumbar Exercises  bridge, KTC, obl 15 each      Knee/Hip Exercises: Aerobic   Nustep  L 2 6 min LE only      Modalities   Modalities  Iontophoresis      Iontophoresis   Type of Iontophoresis  Dexamethasone    Location  Lt posterior greater troch    Dose  1.0cc    Time  80mApm patch, 6 hrs             PT Education - 11/20/17 0944    Education Details  reviewed HEP ( pt with multi questions and issues re: ex)     Person(s) Educated  Patient    Methods  Explanation;Demonstration    Comprehension  Verbalized understanding;Returned demonstration       PT Short Term Goals - 11/16/17 1215      PT SHORT TERM GOAL #1   Title  I with initial HEP      Time  1    Period  Weeks    Status  New    Target Date  11/23/17        PT Long Term Goals - 11/16/17 1215      PT LONG TERM GOAL #1   Title  I with advanced HEP to include a walking program and gym equipment - pt has silver sneakers    Time  4    Period  Weeks    Status  New    Target Date  12/14/17      PT LONG TERM GOAL #2   Title  improve Lt hip strength =/> 5-/5 to allow her to ambulate up/down stairs with minimal to no pain    Time  4    Period  Weeks    Status  New    Target Date  12/14/17  PT LONG TERM GOAL #3   Title  improve FOTO =/< 39% limited     Time  4    Period  Weeks    Status  New    Target Date  12/14/17      PT LONG TERM GOAL #4   Title  demo safe movement patterns with squating and kneeling to decrease risk of reinjury     Time  4    Period  Weeks    Status  New    Target Date  12/14/17            Plan - 11/20/17 0957    Clinical Impression Statement  pt needs mutli cuing and reinforcement of HEP - multi questions. pt has multi complaints throughout body and needs constant reinforcement with all activities.    PT Treatment/Interventions  Iontophoresis 4mg /ml Dexamethasone;Neuromuscular re-education;Dry needling;Manual techniques;Moist Heat;Ultrasound;Patient/family education;Taping;Therapeutic exercise;Cryotherapy;Electrical Stimulation;Passive range of motion    PT Next Visit Plan   hip strengthening ( increase HEP), instruction in safe ways to squat/pick things up.        Patient will benefit from skilled therapeutic intervention in order to improve the following deficits and impairments:  Pain, Decreased activity tolerance, Decreased strength, Impaired perceived functional ability, Difficulty walking, Impaired flexibility  Visit Diagnosis: Pain in left hip  Muscle weakness (generalized)     Problem List Patient Active Problem List   Diagnosis Date Noted  . Hereditary and idiopathic peripheral neuropathy 03/08/2015     PAYSEUR,ANGIE PTA  11/20/2017, 10:03 AM  [facility] [address] [place], [place], [address] Phone: [phone]   Fax:  [phone]  Name: Caitlyn DameJulia A Vester MRN: [id number] Date of Birth: 01/30/1951

## 2017-11-22 ENCOUNTER — Encounter: Payer: Self-pay | Admitting: Physical Therapy

## 2017-11-22 ENCOUNTER — Ambulatory Visit: Payer: Medicare Other | Admitting: Physical Therapy

## 2017-11-22 DIAGNOSIS — M25552 Pain in left hip: Secondary | ICD-10-CM

## 2017-11-22 DIAGNOSIS — M6281 Muscle weakness (generalized): Secondary | ICD-10-CM

## 2017-11-22 DIAGNOSIS — R29898 Other symptoms and signs involving the musculoskeletal system: Secondary | ICD-10-CM

## 2017-11-22 NOTE — Therapy (Signed)
[facility] [address] [place], [place], [address] Phone: [phone]   Fax:  [phone]  Physical Therapy Treatment  Patient Details  Name: Caitlyn Fields MRN: [id number] Date of Birth: 06/27/1950 Referring Provider: Dr Gean BirchwoodFrank Rowan   Encounter Date: 11/22/2017  PT End of Session - 11/22/17 0937    Visit Number  3    Date for PT Re-Evaluation  12/14/17    PT Start Time  0938    PT Stop Time  1018    PT Time Calculation (min)  40 min    Activity Tolerance  Patient tolerated treatment well   required a lot of cueing      Past Medical History:  Diagnosis Date  . Depression   . ONGEXBMW(413.2Headache(784.0)     Past Surgical History:  Procedure Laterality Date  . CESAREAN SECTION      There were no vitals filed for this visit.  Subjective Assessment - 11/22/17 0939    Subjective  Pt reports she is doing her HEP, has multiple questions about all we do. Tolerating ionto well    Patient Stated Goals  return to walking program and get in better shape all around    Currently in Pain?  No/denies   using melaxicam                      [facility] Adult PT Treatment/Exercise - 11/22/17 0001      Self-Care   Self-Care  Other Self-Care Comments    Other Self-Care Comments   recommend [facility]      Exercises   Exercises  Lumbar      Lumbar Exercises: Standing   Other Standing Lumbar Exercises  hip 3 way green tband 15 each leg   10 reps sit to stand with red band around knees   Other Standing Lumbar Exercises  multiple VC      Lumbar Exercises: Quadruped   Other Quadruped Lumbar Exercises  10 reps leg kick backs with red band around knees.       Knee/Hip Exercises: Aerobic   Nustep  L 2 6 min LE only      Modalities   Modalities  Iontophoresis      Iontophoresis   Type of Iontophoresis  Dexamethasone    Location  Lt posterior greater troch    Dose  1.0cc    Time  80mApm patch, 6  hrs             PT Education - 11/22/17 1002    Education Details  Hep progression standing hip band    Person(s) Educated  Patient    Methods  Explanation;Demonstration;Handout    Comprehension  Returned demonstration;Verbalized understanding       PT Short Term Goals - 11/16/17 1215      PT SHORT TERM GOAL #1   Title  I with initial HEP     Time  1    Period  Weeks    Status  New    Target Date  11/23/17        PT Long Term Goals - 11/16/17 1215      PT LONG TERM GOAL #1   Title  I with advanced HEP to include a walking program and gym equipment - pt has silver sneakers    Time  4    Period  Weeks    Status  New    Target Date  12/14/17      PT LONG TERM GOAL #2   Title  improve Lt hip strength =/> 5-/5 to allow her to ambulate up/down stairs with minimal to no pain    Time  4    Period  Weeks    Status  New    Target Date  12/14/17      PT LONG TERM GOAL #3   Title  improve FOTO =/< 39% limited     Time  4    Period  Weeks    Status  New    Target Date  12/14/17      PT LONG TERM GOAL #4   Title  demo safe movement patterns with squating and kneeling to decrease risk of reinjury     Time  4    Period  Weeks    Status  New    Target Date  12/14/17            Plan - 11/22/17 1058    Clinical Impression Statement  Pt continues to have many questions and require a lot of VC for form and reinforcement.  She has decreased hip pain however is still taking meds for this.  Discussed trying to wean off these if able to truly see if hip symptoms are improving.      Rehab Potential  Good    PT Frequency  2x / week    PT Duration  4 weeks    PT Treatment/Interventions  Iontophoresis 4mg /ml Dexamethasone;Neuromuscular re-education;Dry needling;Manual techniques;Moist Heat;Ultrasound;Patient/family education;Taping;Therapeutic exercise;Cryotherapy;Electrical Stimulation;Passive range of motion    PT Next Visit Plan   hip and core strengthening, instruction  in safe ways to squat/pick things up.     Consulted and Agree with Plan of Care  Patient       Patient will benefit from skilled therapeutic intervention in order to improve the following deficits and impairments:  Pain, Decreased activity tolerance, Decreased strength, Impaired perceived functional ability, Difficulty walking, Impaired flexibility  Visit Diagnosis: Pain in left hip  Muscle weakness (generalized)  Other symptoms and signs involving the musculoskeletal system     Problem List Patient Active Problem List   Diagnosis Date Noted  . Hereditary and idiopathic peripheral neuropathy 03/08/2015    Roderic ScarceSusan Imojean Yoshino PT  11/22/2017, 11:00 AM  [facility] [address] [place], [place], [address] Phone: [phone]   Fax:  [phone]  Name: Caitlyn Fields MRN: [id number] Date of Birth: 05/18/1950

## 2017-11-27 ENCOUNTER — Ambulatory Visit: Payer: Medicare Other

## 2017-11-29 ENCOUNTER — Ambulatory Visit: Payer: Medicare Other | Admitting: Physical Therapy

## 2017-11-29 ENCOUNTER — Encounter: Payer: Self-pay | Admitting: Physical Therapy

## 2017-11-29 DIAGNOSIS — M25552 Pain in left hip: Secondary | ICD-10-CM

## 2017-11-29 DIAGNOSIS — M6281 Muscle weakness (generalized): Secondary | ICD-10-CM

## 2017-11-29 NOTE — Therapy (Signed)
[place] [address] [place], [place], [address] Phone: [phone]   Fax:  [phone]  Physical Therapy Treatment  Patient Details  Name: Caitlyn Fields MRN: [id number] Date of Birth: [birth year] Referring Provider: Dr Frederik Pear   Encounter Date: 11/29/2017  PT End of Session - 11/29/17 1148    Visit Number  4    Number of Visits  8    Date for PT Re-Evaluation  12/14/17    PT Start Time  2725    PT Stop Time  1225    PT Time Calculation (min)  44 min       Past Medical History:  Diagnosis Date  . Depression   . DGUYQIHK(742.5)     Past Surgical History:  Procedure Laterality Date  . CESAREAN SECTION      There were no vitals filed for this visit.  Subjective Assessment - 11/29/17 1140    Subjective  strained back carrying chair 1/2 mile. seeing MD for back and knees next week. stopped meds per last PT and pain is minimal.    Currently in Pain?  Yes    Pain Score  3     Pain Location  Hip    Pain Orientation  Left                       [facility] Adult PT Treatment/Exercise - 11/29/17 0001      Exercises   Exercises  Lumbar;Knee/Hip      Knee/Hip Exercises: Stretches   Other Knee/Hip Stretches  hip add stretch       Knee/Hip Exercises: Aerobic   Nustep  L 4 6 min      Knee/Hip Exercises: Machines for Strengthening   Cybex Knee Extension  5# 2 sets 10    Cybex Knee Flexion  15# 2 sets 10    Cybex Leg Press  20# 2 sets 10   VCing needed     Knee/Hip Exercises: Standing   Forward Step Up  Both;10 reps;Hand Hold: 2;Step Height: 6"   opp leg abd- max cuing needed     Knee/Hip Exercises: Seated   Ball Squeeze  15    Clamshell with TheraBand  Green    Marching  Strengthening;20 reps   green tband   Sit to [employer]  without UE support;10 reps   with wt ball     Modalities   Modalities  Iontophoresis      Iontophoresis   Type of Iontophoresis  Dexamethasone    Location   Lt posterior greater troch    Dose  1.0cc    Time  60mpm patch, 6 hrs               PT Short Term Goals - 11/29/17 1147      PT SHORT TERM GOAL #1   Title  I with initial HEP     Status  Achieved        PT Long Term Goals - 11/29/17 1147      PT LONG TERM GOAL #1   Title  I with advanced HEP to include a walking program and gym equipment - pt has silver sneakers    Status  Partially Met      PT LONG TERM GOAL #2   Title  improve Lt hip strength =/> 5-/5 to allow her to ambulate up/down stairs with minimal to no pain    Status  Partially Met  PT LONG TERM GOAL #3   Title  improve FOTO =/< 39% limited     Status  Partially Met      PT LONG TERM GOAL #4   Title  demo safe movement patterns with squating and kneeling to decrease risk of reinjury     Baseline  seeing MD next week for knees that are limiting kneeling    Status  Partially Met            Plan - 11/29/17 1156    Clinical Impression Statement  STG met. progressing with LTG. started machine ex today and tolerated well with multi questions and cuing.pt seeing multi MD next week for add'l c/o back and knee pain.     PT Treatment/Interventions  Iontophoresis 67m/ml Dexamethasone;Neuromuscular re-education;Dry needling;Manual techniques;Moist Heat;Ultrasound;Patient/family education;Taping;Therapeutic exercise;Cryotherapy;Electrical Stimulation;Passive range of motion    PT Next Visit Plan   hip and core strengthening, instruction in safe ways to squat/pick things up.        Patient will benefit from skilled therapeutic intervention in order to improve the following deficits and impairments:  Pain, Decreased activity tolerance, Decreased strength, Impaired perceived functional ability, Difficulty walking, Impaired flexibility  Visit Diagnosis: Pain in left hip  Muscle weakness (generalized)     Problem List Patient Active Problem List   Diagnosis Date Noted  . Hereditary and idiopathic  peripheral neuropathy 03/08/2015    PAYSEUR,ANGIE PTA 11/29/2017, 12:18 PM  [address] [address]Phone: [phone]  Fax:  [phone] Name: Caitlyn HALBERGMRN: [id number] of Birth: [birth year]

## 2017-12-03 ENCOUNTER — Ambulatory Visit: Payer: Medicare Other | Admitting: Physical Therapy

## 2017-12-05 ENCOUNTER — Ambulatory Visit: Payer: Medicare Other | Admitting: Physical Therapy

## 2017-12-05 ENCOUNTER — Encounter: Payer: Self-pay | Admitting: Physical Therapy

## 2017-12-05 DIAGNOSIS — R29898 Other symptoms and signs involving the musculoskeletal system: Secondary | ICD-10-CM

## 2017-12-05 DIAGNOSIS — M25552 Pain in left hip: Secondary | ICD-10-CM

## 2017-12-05 DIAGNOSIS — M6281 Muscle weakness (generalized): Secondary | ICD-10-CM

## 2017-12-05 NOTE — Therapy (Signed)
[place], [place], [address] Phone: [phone]   Fax:  [phone]  Physical Therapy Treatment  Patient Details  Name: Caitlyn Fields MRN: [id number] Date of Birth: 1950-12-09 Referring Provider: Dr Frederik Pear   Encounter Date: 12/05/2017  PT End of Session - 12/05/17 1036    Visit Number  5    Date for PT Re-Evaluation  12/14/17    PT Start Time  0945    PT Stop Time  1036    PT Time Calculation (min)  51 min    Activity Tolerance  Patient tolerated treatment well    Behavior During Therapy  [facility] for tasks assessed/performed       Past Medical History:  Diagnosis Date  . Depression   . MGNOIBBC(488.8)     Past Surgical History:  Procedure Laterality Date  . CESAREAN SECTION      There were no vitals filed for this visit.  Subjective Assessment - 12/05/17 0947    Subjective  will start back on pain med's today. Today back is a little better. Hip has been hurting but not severe    Currently in Pain?  Yes    Pain Score  2    When walking   Pain Location  Hip    Pain Orientation  Left                       [facility] Adult PT Treatment/Exercise - 12/05/17 0001      Exercises   Exercises  Lumbar;Knee/Hip      Knee/Hip Exercises: Aerobic   Nustep  L 4 6 min      Knee/Hip Exercises: Machines for Strengthening   Cybex Knee Extension  5# 2 sets 10    Cybex Knee Flexion  15# 2 sets 10    Cybex Leg Press  20# 2 sets 10      Knee/Hip Exercises: Standing   Forward Step Up  Both;10 reps;Step Height: 4";Step Height: 6";Hand Hold: 0    Other Standing Knee Exercises  hip fle, abd, and ext 2lb x10 each       Knee/Hip Exercises: Seated   Ball Squeeze  15    Clamshell with TheraBand  Green    Sit to [employer]  without UE support;10 reps;2 sets   holding red ball      Modalities   Modalities  Iontophoresis      Iontophoresis   Type of Iontophoresis  Dexamethasone    Location  Lt posterior greater troch    Dose  1.0cc    Time  33mpm patch, 4 hrs               PT Short Term Goals - 11/29/17 1147      PT SHORT TERM GOAL #1   Title  I with initial HEP     Status  Achieved        PT Long Term Goals - 11/29/17 1147      PT LONG TERM GOAL #1   Title  I with advanced HEP to include a walking program and gym equipment - pt has silver sneakers    Status  Partially Met      PT LONG TERM GOAL #2   Title  improve Lt hip strength =/> 5-/5 to allow her to ambulate up/down stairs with minimal to no pain    Status  Partially Met  PT LONG TERM GOAL #3   Title  improve FOTO =/< 39% limited     Status  Partially Met      PT LONG TERM GOAL #4   Title  demo safe movement patterns with squating and kneeling to decrease risk of reinjury     Baseline  seeing MD next week for knees that are limiting kneeling    Status  Partially Met            Plan - 12/05/17 1037    Clinical Impression Statement  Pt reports seeing the PA, and possibly having some budging disk. She did well with machine exercises today. Some fatigue with standing hip interventions. Reports less pain overall.    Rehab Potential  Good    PT Frequency  2x / week    PT Duration  4 weeks    PT Treatment/Interventions  Iontophoresis 24m/ml Dexamethasone;Neuromuscular re-education;Dry needling;Manual techniques;Moist Heat;Ultrasound;Patient/family education;Taping;Therapeutic exercise;Cryotherapy;Electrical Stimulation;Passive range of motion    PT Next Visit Plan   hip and core strengthening, instruction in safe ways to squat/pick things up.        Patient will benefit from skilled therapeutic intervention in order to improve the following deficits and impairments:  Pain, Decreased activity tolerance, Decreased strength, Impaired perceived functional ability, Difficulty walking, Impaired flexibility  Visit Diagnosis: Muscle weakness (generalized)  Pain in left hip  Other  symptoms and signs involving the musculoskeletal system     Problem List Patient Active Problem List   Diagnosis Date Noted  . Hereditary and idiopathic peripheral neuropathy 03/08/2015    RScot Jun8/28/2019, 10:43 AM  [address] [address]Phone: [phone]  Fax:  [phone] Name: Caitlyn BROERMANMRN: [id number] of Birth: [birth year]

## 2017-12-07 ENCOUNTER — Ambulatory Visit: Payer: Medicare Other | Admitting: Physical Therapy

## 2017-12-07 ENCOUNTER — Encounter: Payer: Self-pay | Admitting: Physical Therapy

## 2017-12-07 DIAGNOSIS — M6281 Muscle weakness (generalized): Secondary | ICD-10-CM

## 2017-12-07 DIAGNOSIS — M25552 Pain in left hip: Secondary | ICD-10-CM | POA: Diagnosis not present

## 2017-12-07 NOTE — Therapy (Signed)
[place], [place], [address] Phone: [phone]   Fax:  [phone]  Physical Therapy Treatment  Patient Details  Name: RAVAN SCHLEMMER MRN: [id number] Date of Birth: [birth year] Referring Provider: Dr Frederik Pear   Encounter Date: 12/07/2017  PT End of Session - 12/07/17 0925    Visit Number  6    Date for PT Re-Evaluation  12/14/17    PT Start Time  0845    PT Stop Time  0926    PT Time Calculation (min)  41 min    Activity Tolerance  Patient tolerated treatment well    Behavior During Therapy  [facility] for tasks assessed/performed       Past Medical History:  Diagnosis Date  . Depression   . JHERDEYC(144.8)     Past Surgical History:  Procedure Laterality Date  . CESAREAN SECTION      There were no vitals filed for this visit.  Subjective Assessment - 12/07/17 0845    Subjective  "Fine" no issues after last treatment session    Currently in Pain?  No/denies    Pain Score  0-No pain                       [facility] Adult PT Treatment/Exercise - 12/07/17 0001      Exercises   Exercises  Lumbar;Knee/Hip      Knee/Hip Exercises: Stretches   Passive Hamstring Stretch  Both;3 reps;10 seconds      Knee/Hip Exercises: Aerobic   Nustep  L 4 6 min      Knee/Hip Exercises: Machines for Strengthening   Cybex Knee Extension  5# 2 sets 10    Cybex Knee Flexion  20# 2 sets 10    Cybex Leg Press  20# 2 sets 10      Knee/Hip Exercises: Standing   Lateral Step Up  Both;10 reps;Hand Hold: 0;Step Height: 6";2 sets;1 set    Forward Step Up  Both;10 reps;Step Height: 6";Hand Hold: 0      Knee/Hip Exercises: Seated   Sit to Sand  3 sets;10 reps;without UE support   holding yellow ball               PT Short Term Goals - 11/29/17 1147      PT SHORT TERM GOAL #1   Title  I with initial HEP     Status  Achieved        PT Long Term Goals - 11/29/17 1147      PT  LONG TERM GOAL #1   Title  I with advanced HEP to include a walking program and gym equipment - pt has silver sneakers    Status  Partially Met      PT LONG TERM GOAL #2   Title  improve Lt hip strength =/> 5-/5 to allow her to ambulate up/down stairs with minimal to no pain    Status  Partially Met      PT LONG TERM GOAL #3   Title  improve FOTO =/< 39% limited     Status  Partially Met      PT LONG TERM GOAL #4   Title  demo safe movement patterns with squating and kneeling to decrease risk of reinjury     Baseline  seeing MD next week for knees that are limiting kneeling    Status  Partially Met  Plan - 12/07/17 0926    Clinical Impression Statement  Pt reports that she is back on her pain med's and is feeling fine. Cues not to prevent knees to coming together with sit to stands. Tolerated increase weight with HS curls and added lateral step ups without issue. Cues to keep knees straight with HS stretch.    Rehab Potential  Good    PT Frequency  2x / week    PT Duration  4 weeks    PT Next Visit Plan   hip and core strengthening, instruction in safe ways to squat/pick things up.     PT Home Exercise Plan  added HS stretch with strap       Patient will benefit from skilled therapeutic intervention in order to improve the following deficits and impairments:  Pain, Decreased activity tolerance, Decreased strength, Impaired perceived functional ability, Difficulty walking, Impaired flexibility  Visit Diagnosis: Muscle weakness (generalized)     Problem List Patient Active Problem List   Diagnosis Date Noted  . Hereditary and idiopathic peripheral neuropathy 03/08/2015    Scot Jun, PTA 12/07/2017, 9:28 AM  [place], [place], [address] Phone: [phone]   Fax:  [phone]  Name: BELLAROSE BURTT MRN: [id number] Date of Birth: 12/05/1950

## 2017-12-12 ENCOUNTER — Ambulatory Visit: Payer: Medicare Other | Attending: Orthopedic Surgery | Admitting: Physical Therapy

## 2017-12-12 ENCOUNTER — Encounter: Payer: Self-pay | Admitting: Physical Therapy

## 2017-12-12 DIAGNOSIS — M6281 Muscle weakness (generalized): Secondary | ICD-10-CM | POA: Diagnosis present

## 2017-12-12 DIAGNOSIS — M25552 Pain in left hip: Secondary | ICD-10-CM | POA: Diagnosis not present

## 2017-12-12 DIAGNOSIS — R29898 Other symptoms and signs involving the musculoskeletal system: Secondary | ICD-10-CM

## 2017-12-12 NOTE — Therapy (Signed)
[place], [place], [address] Phone: [phone]   Fax:  [phone]  Physical Therapy Treatment  Patient Details  Name: Caitlyn Fields MRN: [id number] Date of Birth: 1951-03-28 Referring Provider: Dr Frederik Pear   Encounter Date: 12/12/2017  PT End of Session - 12/12/17 1513    Visit Number  7    Date for PT Re-Evaluation  12/14/17    PT Start Time  1430    PT Stop Time  1515    PT Time Calculation (min)  45 min    Activity Tolerance  Patient tolerated treatment well    Behavior During Therapy  [facility] for tasks assessed/performed       Past Medical History:  Diagnosis Date  . Depression   . ZMOQHUTM(546.5)     Past Surgical History:  Procedure Laterality Date  . CESAREAN SECTION      There were no vitals filed for this visit.  Subjective Assessment - 12/12/17 1429    Subjective  Pt reports that she got her an hour early so she walked around the building for about 20 minutes. Pt stated that her legs feel stronger    Currently in Pain?  No/denies    Pain Score  0-No pain                       [facility] Adult PT Treatment/Exercise - 12/12/17 0001      Exercises   Exercises  Lumbar;Knee/Hip      Lumbar Exercises: Machines for Strengthening   Other Lumbar Machine Exercise  Rows and lats 20lb 2x10       Lumbar Exercises: Standing   Row  Theraband;20 reps;Both    Theraband Level (Row)  Level 2 (Red)    Shoulder Extension  Theraband;20 reps;Both    Theraband Level (Shoulder Extension)  Level 2 (Red)      Knee/Hip Exercises: Aerobic   Nustep  L 4 7 min      Knee/Hip Exercises: Machines for Strengthening   Cybex Knee Extension  5# 2 sets 10    Cybex Knee Flexion  20# 2 sets 10    Cybex Leg Press  30lb 2x10       Knee/Hip Exercises: Standing   Lateral Step Up  Both;10 reps;Hand Hold: 0;Step Height: 6";2 sets;1 set    Forward Step Up  Both;10 reps;Step Height:  6";Hand Hold: 0               PT Short Term Goals - 11/29/17 1147      PT SHORT TERM GOAL #1   Title  I with initial HEP     Status  Achieved        PT Long Term Goals - 11/29/17 1147      PT LONG TERM GOAL #1   Title  I with advanced HEP to include a walking program and gym equipment - pt has silver sneakers    Status  Partially Met      PT LONG TERM GOAL #2   Title  improve Lt hip strength =/> 5-/5 to allow her to ambulate up/down stairs with minimal to no pain    Status  Partially Met      PT LONG TERM GOAL #3   Title  improve FOTO =/< 39% limited     Status  Partially Met      PT LONG TERM GOAL #4   Title  demo safe movement patterns with squating and kneeling to decrease risk of reinjury     Baseline  seeing MD next week for knees that are limiting kneeling    Status  Partially Met            Plan - 12/12/17 1514    Clinical Impression Statement  No issues reported with today's interventions. Added some postural strengthening with no reports of pain. Postural cues needed with seated rows and extensions.     Rehab Potential  Good    PT Frequency  2x / week    PT Duration  4 weeks    PT Treatment/Interventions  Iontophoresis 20m/ml Dexamethasone;Neuromuscular re-education;Dry needling;Manual techniques;Moist Heat;Ultrasound;Patient/family education;Taping;Therapeutic exercise;Cryotherapy;Electrical Stimulation;Passive range of motion    PT Next Visit Plan   hip and core strengthening, instruction in safe ways to squat/pick things up.     PT Home Exercise Plan  Rows and shoulder extension       Patient will benefit from skilled therapeutic intervention in order to improve the following deficits and impairments:  Pain, Decreased activity tolerance, Decreased strength, Impaired perceived functional ability, Difficulty walking, Impaired flexibility  Visit Diagnosis: Pain in left hip  Muscle weakness (generalized)  Other symptoms and signs involving the  musculoskeletal system     Problem List Patient Active Problem List   Diagnosis Date Noted  . Hereditary and idiopathic peripheral neuropathy 03/08/2015    RScot Jun PTA 12/12/2017, 3:17 PM  [address] [address]Phone: [phone]  Fax:  [phone] Name: Caitlyn SPATZMRN: [id number] of Birth: 1July 12, 1952

## 2017-12-14 ENCOUNTER — Ambulatory Visit: Payer: Medicare Other | Admitting: Physical Therapy

## 2017-12-14 ENCOUNTER — Encounter: Payer: Self-pay | Admitting: Physical Therapy

## 2017-12-14 DIAGNOSIS — M25552 Pain in left hip: Secondary | ICD-10-CM | POA: Diagnosis not present

## 2017-12-14 DIAGNOSIS — M6281 Muscle weakness (generalized): Secondary | ICD-10-CM

## 2017-12-14 DIAGNOSIS — R29898 Other symptoms and signs involving the musculoskeletal system: Secondary | ICD-10-CM

## 2017-12-14 NOTE — Therapy (Signed)
[place], [place], [address] Phone: [phone]   Fax:  [phone]  Physical Therapy Treatment  Patient Details  Name: BEATRYCE COLOMBO MRN: [id number] Date of Birth: February 04, 1951 Referring Provider: Dr Frederik Pear   Encounter Date: 12/14/2017  PT End of Session - 12/14/17 1147    Visit Number  8    Date for PT Re-Evaluation  12/14/17    PT Start Time  1054    PT Stop Time  1147    PT Time Calculation (min)  53 min    Activity Tolerance  Patient tolerated treatment well    Behavior During Therapy  [facility] for tasks assessed/performed       Past Medical History:  Diagnosis Date  . Depression   . ANVBTYOM(600.4)     Past Surgical History:  Procedure Laterality Date  . CESAREAN SECTION      There were no vitals filed for this visit.  Subjective Assessment - 12/14/17 1047    Subjective  Pt reports that her fingers are hurting from griping in order to do the exercises.     Currently in Pain?  Yes    Pain Score  2     Pain Location  Hip    Pain Orientation  Left                       [facility] Adult PT Treatment/Exercise - 12/14/17 0001      Exercises   Exercises  Knee/Hip      Lumbar Exercises: Machines for Strengthening   Other Lumbar Machine Exercise  Rows 10lb 2x5, lats 10lb 2x10       Lumbar Exercises: Standing   Row  Theraband;20 reps;Both    Theraband Level (Row)  Level 2 (Red)    Shoulder Extension  Theraband;20 reps;Both    Theraband Level (Shoulder Extension)  Level 2 (Red)      Knee/Hip Exercises: Aerobic   Recumbent Bike  L0 x5 min     Nustep  L 4 4 min      Knee/Hip Exercises: Machines for Strengthening   Cybex Knee Extension  5# 2 sets 10    Cybex Knee Flexion  20# 2 sets 10               PT Short Term Goals - 11/29/17 1147      PT SHORT TERM GOAL #1   Title  I with initial HEP     Status  Achieved        PT Long Term Goals -  11/29/17 1147      PT LONG TERM GOAL #1   Title  I with advanced HEP to include a walking program and gym equipment - pt has silver sneakers    Status  Partially Met      PT LONG TERM GOAL #2   Title  improve Lt hip strength =/> 5-/5 to allow her to ambulate up/down stairs with minimal to no pain    Status  Partially Met      PT LONG TERM GOAL #3   Title  improve FOTO =/< 39% limited     Status  Partially Met      PT LONG TERM GOAL #4   Title  demo safe movement patterns with squating and kneeling to decrease risk of reinjury     Baseline  seeing MD next week for knees that are limiting kneeling  Status  Partially Met            Plan - 12/14/17 1150    Clinical Impression Statement  Despite reporting little hand and hip pain she tolerated today's interventions well. Expressed that muscle soreness is out bodies normal reaction to exercises. Postural cues provided during standing rows and extensions to keep shoulders back. and core tight.     Rehab Potential  Good    PT Frequency  2x / week    PT Duration  4 weeks    PT Treatment/Interventions  Iontophoresis 63m/ml Dexamethasone;Neuromuscular re-education;Dry needling;Manual techniques;Moist Heat;Ultrasound;Patient/family education;Taping;Therapeutic exercise;Cryotherapy;Electrical Stimulation;Passive range of motion    PT Next Visit Plan  progress to independent gym program    PT HClaytonpt handout of all machine interventions performed in therapy with appropriate weight to start in gym.       Patient will benefit from skilled therapeutic intervention in order to improve the following deficits and impairments:  Pain, Decreased activity tolerance, Decreased strength, Impaired perceived functional ability, Difficulty walking, Impaired flexibility  Visit Diagnosis: Pain in left hip  Muscle weakness (generalized)  Other symptoms and signs involving the musculoskeletal system     Problem List Patient  Active Problem List   Diagnosis Date Noted  . Hereditary and idiopathic peripheral neuropathy 03/08/2015    RScot Jun PTA 12/14/2017, 11:54 AM  [address] [address]Phone: [phone]  Fax:  [phone] Name: JGEROLDINE ESQUIVIASMRN: [id number] of Birth: 120-Sep-1952

## 2017-12-17 NOTE — Addendum Note (Signed)
Addended by: Jearld Lesch on: 12/17/2017 07:47 AM   Modules accepted: Orders

## 2017-12-19 NOTE — Therapy (Addendum)
PHYSICAL THERAPY DISCHARGE SUMMARY   Plan: Patient agrees to discharge.  Patient goals were partially met. Patient is being discharged due to not returning since the last visit.  ?????

## 2019-07-18 ENCOUNTER — Other Ambulatory Visit: Payer: Self-pay | Admitting: Family Medicine

## 2019-07-18 DIAGNOSIS — M8580 Other specified disorders of bone density and structure, unspecified site: Secondary | ICD-10-CM

## 2019-08-11 ENCOUNTER — Other Ambulatory Visit: Payer: Self-pay | Admitting: Family Medicine

## 2019-08-11 DIAGNOSIS — Z1231 Encounter for screening mammogram for malignant neoplasm of breast: Secondary | ICD-10-CM

## 2019-10-30 ENCOUNTER — Other Ambulatory Visit: Payer: Medicare Other

## 2019-10-30 ENCOUNTER — Ambulatory Visit: Payer: Medicare Other

## 2020-04-22 DIAGNOSIS — R319 Hematuria, unspecified: Secondary | ICD-10-CM | POA: Diagnosis not present

## 2020-04-23 DIAGNOSIS — R319 Hematuria, unspecified: Secondary | ICD-10-CM | POA: Diagnosis not present

## 2020-11-23 DIAGNOSIS — Z0000 Encounter for general adult medical examination without abnormal findings: Secondary | ICD-10-CM | POA: Diagnosis not present

## 2020-11-23 DIAGNOSIS — Z23 Encounter for immunization: Secondary | ICD-10-CM | POA: Diagnosis not present

## 2020-11-23 DIAGNOSIS — E559 Vitamin D deficiency, unspecified: Secondary | ICD-10-CM | POA: Diagnosis not present

## 2020-11-24 DIAGNOSIS — E785 Hyperlipidemia, unspecified: Secondary | ICD-10-CM | POA: Diagnosis not present

## 2020-11-24 DIAGNOSIS — E559 Vitamin D deficiency, unspecified: Secondary | ICD-10-CM | POA: Diagnosis not present

## 2021-01-13 DIAGNOSIS — M546 Pain in thoracic spine: Secondary | ICD-10-CM | POA: Diagnosis not present

## 2021-01-13 DIAGNOSIS — E559 Vitamin D deficiency, unspecified: Secondary | ICD-10-CM | POA: Diagnosis not present

## 2021-01-13 DIAGNOSIS — M8588 Other specified disorders of bone density and structure, other site: Secondary | ICD-10-CM | POA: Diagnosis not present

## 2021-01-13 DIAGNOSIS — E785 Hyperlipidemia, unspecified: Secondary | ICD-10-CM | POA: Diagnosis not present

## 2021-01-13 DIAGNOSIS — B070 Plantar wart: Secondary | ICD-10-CM | POA: Diagnosis not present

## 2021-01-14 ENCOUNTER — Other Ambulatory Visit: Payer: Self-pay | Admitting: Family Medicine

## 2021-01-14 ENCOUNTER — Other Ambulatory Visit: Payer: Self-pay

## 2021-01-14 ENCOUNTER — Ambulatory Visit
Admission: RE | Admit: 2021-01-14 | Discharge: 2021-01-14 | Disposition: A | Discharge status: Home / Self Care | Payer: Medicare Other | Source: Ambulatory Visit | Attending: Family Medicine | Admitting: Family Medicine

## 2021-01-14 DIAGNOSIS — R52 Pain, unspecified: Secondary | ICD-10-CM

## 2021-01-14 DIAGNOSIS — M8588 Other specified disorders of bone density and structure, other site: Secondary | ICD-10-CM | POA: Diagnosis not present

## 2021-01-14 DIAGNOSIS — M47814 Spondylosis without myelopathy or radiculopathy, thoracic region: Secondary | ICD-10-CM | POA: Diagnosis not present

## 2021-01-14 DIAGNOSIS — M4313 Spondylolisthesis, cervicothoracic region: Secondary | ICD-10-CM | POA: Diagnosis not present

## 2021-01-14 DIAGNOSIS — M47816 Spondylosis without myelopathy or radiculopathy, lumbar region: Secondary | ICD-10-CM | POA: Diagnosis not present

## 2021-01-14 DIAGNOSIS — I878 Other specified disorders of veins: Secondary | ICD-10-CM | POA: Diagnosis not present

## 2021-01-14 DIAGNOSIS — M419 Scoliosis, unspecified: Secondary | ICD-10-CM | POA: Diagnosis not present

## 2021-01-24 DIAGNOSIS — R509 Fever, unspecified: Secondary | ICD-10-CM | POA: Diagnosis not present

## 2021-01-24 DIAGNOSIS — R059 Cough, unspecified: Secondary | ICD-10-CM | POA: Diagnosis not present

## 2021-01-24 DIAGNOSIS — U071 COVID-19: Secondary | ICD-10-CM | POA: Diagnosis not present

## 2021-01-25 ENCOUNTER — Encounter (HOSPITAL_BASED_OUTPATIENT_CLINIC_OR_DEPARTMENT_OTHER): Payer: Self-pay | Admitting: *Deleted

## 2021-01-25 ENCOUNTER — Emergency Department (HOSPITAL_BASED_OUTPATIENT_CLINIC_OR_DEPARTMENT_OTHER): Payer: Medicare Other

## 2021-01-25 ENCOUNTER — Emergency Department (HOSPITAL_BASED_OUTPATIENT_CLINIC_OR_DEPARTMENT_OTHER)
Admission: EM | Admit: 2021-01-25 | Discharge: 2021-01-25 | Disposition: A | Discharge status: Home / Self Care | Payer: Medicare Other | Attending: Emergency Medicine | Admitting: Emergency Medicine

## 2021-01-25 ENCOUNTER — Other Ambulatory Visit: Payer: Self-pay

## 2021-01-25 DIAGNOSIS — Z743 Need for continuous supervision: Secondary | ICD-10-CM | POA: Diagnosis not present

## 2021-01-25 DIAGNOSIS — G4489 Other headache syndrome: Secondary | ICD-10-CM | POA: Diagnosis not present

## 2021-01-25 DIAGNOSIS — J181 Lobar pneumonia, unspecified organism: Secondary | ICD-10-CM | POA: Diagnosis not present

## 2021-01-25 DIAGNOSIS — R059 Cough, unspecified: Secondary | ICD-10-CM | POA: Diagnosis not present

## 2021-01-25 DIAGNOSIS — Z87891 Personal history of nicotine dependence: Secondary | ICD-10-CM | POA: Diagnosis not present

## 2021-01-25 DIAGNOSIS — R519 Headache, unspecified: Secondary | ICD-10-CM | POA: Diagnosis not present

## 2021-01-25 DIAGNOSIS — R509 Fever, unspecified: Secondary | ICD-10-CM | POA: Diagnosis not present

## 2021-01-25 DIAGNOSIS — J189 Pneumonia, unspecified organism: Secondary | ICD-10-CM

## 2021-01-25 MED ORDER — DOXYCYCLINE HYCLATE 100 MG PO TABS
100.0000 mg | ORAL_TABLET | Freq: Once | ORAL | Status: AC
  Administered 2021-01-25: 100 mg via ORAL
  Filled 2021-01-25: qty 1

## 2021-01-25 MED ORDER — DOXYCYCLINE HYCLATE 100 MG PO CAPS: 100.0000 mg | ORAL_CAPSULE | Freq: Two times a day (BID) | ORAL | 0 refills | Status: DC

## 2021-01-25 NOTE — ED Notes (Signed)
Pt calling for ride home 

## 2021-01-25 NOTE — ED Triage Notes (Signed)
Pt arrives from home via [facility]. Pt c/o Headache, fever, and cough. Frontal headache x 3 days. A/o x 4. Dry cough, non productive. Seen at [facility] today,  pending COVID results. En route, vitals 118/76, hr 90, 18RR, 97% RA, 139 CBG, 98.5 temp

## 2021-01-25 NOTE — ED Triage Notes (Signed)
Pt reports 3 days headache, earache, cough and fevers. Last mucinex this morning.  Went to [facility] this morning, unsure of covid results.

## 2021-01-25 NOTE — ED Notes (Signed)
Pt discharged to home. Discharge instructions have been discussed with patient and/or family members. Pt verbally acknowledges understanding d/c instructions, and endorses comprehension to checkout at registration before leaving.  °

## 2021-01-25 NOTE — ED Provider Notes (Signed)
[facility] EMERGENCY DEPARTMENT Provider Note   CSN: [id number] Arrival date & time: 01/25/21  0042     History Chief Complaint  Patient presents with   Cough    Caitlyn Fields is a 70 y.o. female.  HPI  70 year old female presents emergency department with productive cough.  Patient states for the past 3 days she has had fatigue, myalgias, cough with low-grade fevers.  She went to her primary doctor yesterday morning who swabbed her for COVID, she verbally reports that this was negative.  She is been taking over-the-counter medications like Mucinex with only minimal relief.  Denies any GI symptoms.  No active chest or back pain.  Past Medical History:  Diagnosis Date   Depression    Headache(784.0)     Patient Active Problem List   Diagnosis Date Noted   Hereditary and idiopathic peripheral neuropathy 03/08/2015    Past Surgical History:  Procedure Laterality Date   CESAREAN SECTION       OB History   No obstetric history on file.     Family History  Problem Relation Age of Onset   Heart murmur Father    Alcoholism Father    Heart attack Father        Deceased, 7   Depression Mother    Lung cancer Mother        Deceased, 39   Depression Brother    Healthy Brother    Depression Brother    Healthy Son        Twins    Social History   Tobacco Use   Smoking status: Former    Packs/day: 1.00    Years: 6.00    Pack years: 6.00    Types: Cigarettes   Smokeless tobacco: Never  Substance Use Topics   Alcohol use: No    Alcohol/week: 0.0 standard drinks   Drug use: No    Home Medications Prior to Admission medications   Medication Sig Start Date End Date Taking? Authorizing Provider  FLUoxetine (PROZAC) 10 MG tablet Take 30 mg by mouth daily after breakfast.     [provider]  loratadine (CLARITIN) 10 MG tablet Take 10 mg by mouth daily as needed for allergies.    [provider]  omeprazole (PRILOSEC) 20 MG  capsule Take 1 capsule (20 mg total) by mouth daily. Daily for 2 weeks, then as needed. Patient taking differently: Take 20 mg by mouth daily as needed (reflux, indigestion).  08/03/14   Charm Rings, MD    Allergies    Sulfa antibiotics  Review of Systems   Review of Systems  Constitutional:  Positive for appetite change, chills, fatigue and fever.  HENT:  Positive for congestion.   Eyes:  Negative for visual disturbance.  Respiratory:  Positive for cough and shortness of breath. Negative for wheezing.   Cardiovascular:  Negative for chest pain, palpitations and leg swelling.  Gastrointestinal:  Negative for abdominal pain, diarrhea and vomiting.  Genitourinary:  Negative for dysuria and flank pain.  Musculoskeletal:  Positive for myalgias. Negative for back pain.  Skin:  Negative for rash.  Neurological:  Negative for headaches.   Physical Exam Updated Vital Signs BP (!) 152/90 (BP Location: Right Arm)   Pulse 73   Temp 99.1 F (37.3 C) (Oral)   Resp 16   Ht 5\' 3"  (1.6 m)   Wt 63 kg   SpO2 97%   BMI 24.62 kg/m   Physical Exam Vitals and nursing note  reviewed.  Constitutional:      Appearance: Normal appearance.  HENT:     Head: Normocephalic.     Mouth/Throat:     Mouth: Mucous membranes are moist.  Cardiovascular:     Rate and Rhythm: Normal rate.  Pulmonary:     Effort: Pulmonary effort is normal. No respiratory distress.  Abdominal:     Palpations: Abdomen is soft.     Tenderness: There is no abdominal tenderness.  Skin:    General: Skin is warm.  Neurological:     Mental Status: She is alert and oriented to person, place, and time. Mental status is at baseline.  Psychiatric:        Mood and Affect: Mood normal.    ED Results / Procedures / Treatments   Labs (all labs ordered are listed, but only abnormal results are displayed) Labs Reviewed - No data to display  EKG None  Radiology DG Chest 2 View  Result Date: 01/25/2021 CLINICAL DATA:   Cough, fever, headache EXAM: CHEST - 2 VIEW COMPARISON:  10/02/2016 FINDINGS: Mild left lower lung opacity, overlying the heart on the lateral view, suspicious for lingular pneumonia. No pleural effusion or pneumothorax. The heart is normal in size. Visualized osseous structures are within normal limits. IMPRESSION: Mild left lower lung opacity, suspicious for lingular pneumonia. Electronically Signed   By: Charline Bills M.D.   On: 01/25/2021 01:38    Procedures Procedures   Medications Ordered in ED Medications  doxycycline (VIBRA-TABS) tablet 100 mg (100 mg Oral Given 01/25/21 0827)    ED Course  I have reviewed the triage vital signs and the nursing notes.  Pertinent labs & imaging results that were available during my care of the patient were reviewed by me and considered in my medical decision making (see chart for details).    MDM Rules/Calculators/A&P                           70 year old female presents the emergency department with 3 days of illness.  Primary concern is productive cough.  Had a COVID/flu test yesterday that she reports was negative.  She is fatigued appearing but nontoxic.  Normal vitals.  Chest x-ray shows left-sided pneumonia.  Plan to treat with oral antibiotics.  At this time does not appear to warrant emergent lab evaluation.  She is on room air, ambulatory.  Patient at this time appears safe and stable for discharge and will be treated as an outpatient.  Discharge plan and strict return to ED precautions discussed, patient verbalizes understanding and agreement.  Final Clinical Impression(s) / ED Diagnoses Final diagnoses:  None    Rx / DC Orders ED Discharge Orders     None        Rozelle Logan, DO 01/25/21 1856

## 2021-01-25 NOTE — Discharge Instructions (Signed)
You have been seen and discharged from the emergency department.  Your x-ray showed a pneumonia.  Take antibiotic as directed.  Continue to take over-the-counter medications for cough/symptom relief.  Follow-up with your primary provider for reevaluation and further care. Take home medications as prescribed. If you have any worsening symptoms or further concerns for your health please return to an emergency department for further evaluation.

## 2021-02-10 DIAGNOSIS — H9202 Otalgia, left ear: Secondary | ICD-10-CM | POA: Diagnosis not present

## 2021-03-14 DIAGNOSIS — Z1231 Encounter for screening mammogram for malignant neoplasm of breast: Secondary | ICD-10-CM | POA: Diagnosis not present

## 2021-03-15 DIAGNOSIS — B070 Plantar wart: Secondary | ICD-10-CM | POA: Diagnosis not present

## 2021-03-15 DIAGNOSIS — G3184 Mild cognitive impairment, so stated: Secondary | ICD-10-CM | POA: Diagnosis not present

## 2021-03-15 DIAGNOSIS — E785 Hyperlipidemia, unspecified: Secondary | ICD-10-CM | POA: Diagnosis not present

## 2021-03-15 DIAGNOSIS — E559 Vitamin D deficiency, unspecified: Secondary | ICD-10-CM | POA: Diagnosis not present

## 2021-03-15 DIAGNOSIS — M546 Pain in thoracic spine: Secondary | ICD-10-CM | POA: Diagnosis not present

## 2021-03-15 DIAGNOSIS — M8588 Other specified disorders of bone density and structure, other site: Secondary | ICD-10-CM | POA: Diagnosis not present

## 2021-03-25 DIAGNOSIS — M7062 Trochanteric bursitis, left hip: Secondary | ICD-10-CM | POA: Diagnosis not present

## 2021-03-25 DIAGNOSIS — S39012A Strain of muscle, fascia and tendon of lower back, initial encounter: Secondary | ICD-10-CM | POA: Diagnosis not present

## 2021-04-08 DIAGNOSIS — R928 Other abnormal and inconclusive findings on diagnostic imaging of breast: Secondary | ICD-10-CM | POA: Diagnosis not present

## 2021-05-04 ENCOUNTER — Ambulatory Visit: Payer: Medicare Other | Admitting: Physical Therapy

## 2021-05-11 ENCOUNTER — Ambulatory Visit: Payer: Medicare Other | Attending: Family Medicine | Admitting: Physical Therapy

## 2021-05-13 ENCOUNTER — Other Ambulatory Visit: Payer: Self-pay

## 2021-05-18 ENCOUNTER — Ambulatory Visit: Payer: Medicare Other | Admitting: Physical Therapy

## 2021-05-20 DIAGNOSIS — E559 Vitamin D deficiency, unspecified: Secondary | ICD-10-CM | POA: Diagnosis not present

## 2021-05-20 DIAGNOSIS — M546 Pain in thoracic spine: Secondary | ICD-10-CM | POA: Diagnosis not present

## 2021-05-20 DIAGNOSIS — M8588 Other specified disorders of bone density and structure, other site: Secondary | ICD-10-CM | POA: Diagnosis not present

## 2021-05-20 DIAGNOSIS — E785 Hyperlipidemia, unspecified: Secondary | ICD-10-CM | POA: Diagnosis not present

## 2021-05-20 DIAGNOSIS — B070 Plantar wart: Secondary | ICD-10-CM | POA: Diagnosis not present

## 2021-05-20 DIAGNOSIS — G3184 Mild cognitive impairment, so stated: Secondary | ICD-10-CM | POA: Diagnosis not present

## 2021-06-02 DIAGNOSIS — E785 Hyperlipidemia, unspecified: Secondary | ICD-10-CM | POA: Diagnosis not present

## 2021-06-02 DIAGNOSIS — E559 Vitamin D deficiency, unspecified: Secondary | ICD-10-CM | POA: Diagnosis not present

## 2021-08-01 DIAGNOSIS — R300 Dysuria: Secondary | ICD-10-CM | POA: Diagnosis not present

## 2021-08-01 DIAGNOSIS — R42 Dizziness and giddiness: Secondary | ICD-10-CM | POA: Diagnosis not present

## 2021-08-29 DIAGNOSIS — R300 Dysuria: Secondary | ICD-10-CM | POA: Diagnosis not present

## 2021-08-29 DIAGNOSIS — N390 Urinary tract infection, site not specified: Secondary | ICD-10-CM | POA: Diagnosis not present

## 2021-09-30 ENCOUNTER — Ambulatory Visit (INDEPENDENT_AMBULATORY_CARE_PROVIDER_SITE_OTHER): Payer: Medicare Other | Admitting: Podiatry

## 2021-09-30 ENCOUNTER — Encounter: Payer: Self-pay | Admitting: Podiatry

## 2021-09-30 DIAGNOSIS — Q828 Other specified congenital malformations of skin: Secondary | ICD-10-CM

## 2021-09-30 DIAGNOSIS — B070 Plantar wart: Secondary | ICD-10-CM | POA: Diagnosis not present

## 2021-10-06 ENCOUNTER — Other Ambulatory Visit: Payer: Self-pay

## 2021-10-06 ENCOUNTER — Emergency Department (HOSPITAL_COMMUNITY): Payer: Medicare Other

## 2021-10-06 ENCOUNTER — Encounter (HOSPITAL_COMMUNITY): Payer: Self-pay | Admitting: Emergency Medicine

## 2021-10-06 ENCOUNTER — Emergency Department (HOSPITAL_COMMUNITY)
Admission: EM | Admit: 2021-10-06 | Discharge: 2021-10-06 | Disposition: A | Discharge status: Home / Self Care | Payer: Medicare Other | Attending: Emergency Medicine | Admitting: Emergency Medicine

## 2021-10-06 DIAGNOSIS — R0789 Other chest pain: Secondary | ICD-10-CM | POA: Insufficient documentation

## 2021-10-06 DIAGNOSIS — R6889 Other general symptoms and signs: Secondary | ICD-10-CM | POA: Diagnosis not present

## 2021-10-06 DIAGNOSIS — R079 Chest pain, unspecified: Secondary | ICD-10-CM

## 2021-10-06 DIAGNOSIS — Z743 Need for continuous supervision: Secondary | ICD-10-CM | POA: Diagnosis not present

## 2021-10-06 DIAGNOSIS — R1010 Upper abdominal pain, unspecified: Secondary | ICD-10-CM | POA: Insufficient documentation

## 2021-10-06 DIAGNOSIS — R109 Unspecified abdominal pain: Secondary | ICD-10-CM | POA: Diagnosis not present

## 2021-10-06 DIAGNOSIS — R110 Nausea: Secondary | ICD-10-CM | POA: Diagnosis not present

## 2021-10-06 LAB — URINALYSIS, ROUTINE W REFLEX MICROSCOPIC
Bacteria, UA: NONE SEEN
Bilirubin Urine: NEGATIVE
Glucose, UA: NEGATIVE mg/dL
Hgb urine dipstick: NEGATIVE
Ketones, ur: NEGATIVE mg/dL
Nitrite: NEGATIVE
Protein, ur: NEGATIVE mg/dL
Specific Gravity, Urine: 1.012 (ref 1.005–1.030)
pH: 7 (ref 5.0–8.0)

## 2021-10-06 LAB — COMPREHENSIVE METABOLIC PANEL
ALT: 22 U/L (ref 0–44)
AST: 25 U/L (ref 15–41)
Albumin: 4 g/dL (ref 3.5–5.0)
Alkaline Phosphatase: 57 U/L (ref 38–126)
Anion gap: 8 (ref 5–15)
BUN: 19 mg/dL (ref 8–23)
CO2: 28 mmol/L (ref 22–32)
Calcium: 10.3 mg/dL (ref 8.9–10.3)
Chloride: 104 mmol/L (ref 98–111)
Creatinine, Ser: 0.98 mg/dL (ref 0.44–1.00)
GFR, Estimated: >60 mL/min (ref >60)
Glucose, Bld: 101 mg/dL — ABNORMAL HIGH (ref 70–99)
Potassium: 3.8 mmol/L (ref 3.5–5.1)
Sodium: 140 mmol/L (ref 135–145)
Total Bilirubin: 0.6 mg/dL (ref 0.3–1.2)
Total Protein: 6.7 g/dL (ref 6.5–8.1)

## 2021-10-06 LAB — CBC
HCT: 42.6 % (ref 36.0–46.0)
Hemoglobin: 13.9 g/dL (ref 12.0–15.0)
MCH: 31.7 pg (ref 26.0–34.0)
MCHC: 32.6 g/dL (ref 30.0–36.0)
MCV: 97 fL (ref 80.0–100.0)
Platelets: 192 10*3/uL (ref 150–400)
RBC: 4.39 MIL/uL (ref 3.87–5.11)
RDW: 12.5 % (ref 11.5–15.5)
WBC: 6.6 10*3/uL (ref 4.0–10.5)
nRBC: 0 % (ref 0.0–0.2)

## 2021-10-06 LAB — TROPONIN I (HIGH SENSITIVITY)
Troponin I (High Sensitivity): 4 ng/L (ref <18)
Troponin I (High Sensitivity): 4 ng/L (ref <18)

## 2021-10-06 LAB — LIPASE, BLOOD: Lipase: 65 U/L — ABNORMAL HIGH (ref 11–51)

## 2021-10-06 NOTE — Discharge Instructions (Signed)
Restart your prilosec and take every day for 2 weeks

## 2021-10-06 NOTE — ED Provider Notes (Signed)
[facility] EMERGENCY DEPARTMENT Provider Note   CSN: [id number] Arrival date & time: 10/06/21  0050     History  Chief Complaint  Patient presents with   Abdominal Pain Caitlyn Fields    Caitlyn Fields is a 71 y.o. female.  Patient presents to the emergency department for evaluation of abdominal/chest discomfort with nausea.  Symptoms began prior to coming to the ER.  She came in by ambulance, was given Zofran and reports relief of the nausea.       Home Medications Prior to Admission medications   Medication Sig Start Date End Date Taking? Authorizing Provider  buPROPion (WELLBUTRIN) 75 MG tablet Take 75 mg by mouth 2 (two) times daily. 05/04/21   [provider]  doxycycline (VIBRAMYCIN) 100 MG capsule Take 1 capsule (100 mg total) by mouth 2 (two) times daily. 01/25/21   Horton, Kristie M, DO  FLUoxetine (PROZAC) 10 MG tablet Take 30 mg by mouth daily after breakfast.     [provider]  loratadine (CLARITIN) 10 MG tablet Take 10 mg by mouth daily as needed for allergies.    [provider]  omeprazole (PRILOSEC) 20 MG capsule Take 1 capsule (20 mg total) by mouth daily. Daily for 2 weeks, then as needed. Patient taking differently: Take 20 mg by mouth daily as needed (reflux, indigestion).  08/03/14   Charm Rings, MD      Allergies    Sulfa antibiotics    Review of Systems   Review of Systems  Physical Exam Updated Vital Signs BP (!) 148/74   Pulse (!) 57   Temp 97.6 F (36.4 C) (Oral)   Resp 11   SpO2 99%  Physical Exam Vitals and nursing note reviewed.  Constitutional:      General: She is not in acute distress.    Appearance: She is well-developed.  HENT:     Head: Normocephalic and atraumatic.     Mouth/Throat:     Mouth: Mucous membranes are moist.  Eyes:     General: Vision grossly intact. Gaze aligned appropriately.     Extraocular Movements: Extraocular movements intact.     Conjunctiva/sclera:  Conjunctivae normal.  Cardiovascular:     Rate and Rhythm: Normal rate and regular rhythm.     Pulses: Normal pulses.     Heart sounds: Normal heart sounds, S1 normal and S2 normal. No murmur heard.    No friction rub. No gallop.  Pulmonary:     Effort: Pulmonary effort is normal. No respiratory distress.     Breath sounds: Normal breath sounds.  Abdominal:     General: Bowel sounds are normal.     Palpations: Abdomen is soft.     Tenderness: There is no abdominal tenderness. There is no guarding or rebound.     Hernia: No hernia is present.  Musculoskeletal:        General: No swelling.     Cervical back: Full passive range of motion without pain, normal range of motion and neck supple. No spinous process tenderness or muscular tenderness. Normal range of motion.     Right lower leg: No edema.     Left lower leg: No edema.  Skin:    General: Skin is warm and dry.     Capillary Refill: Capillary refill takes less than 2 seconds.     Findings: No ecchymosis, erythema, rash or wound.  Neurological:     General: No focal deficit present.     Mental  Status: She is alert and oriented to person, place, and time.     GCS: GCS eye subscore is 4. GCS verbal subscore is 5. GCS motor subscore is 6.     Cranial Nerves: Cranial nerves 2-12 are intact.     Sensory: Sensation is intact.     Motor: Motor function is intact.     Coordination: Coordination is intact.  Psychiatric:        Attention and Perception: Attention normal.        Mood and Affect: Mood normal.        Speech: Speech normal.        Behavior: Behavior normal.     ED Results / Procedures / Treatments   Labs (all labs ordered are listed, but only abnormal results are displayed) Labs Reviewed  LIPASE, BLOOD - Abnormal; Notable for the following components:      Result Value   Lipase 65 (*)    All other components within normal limits  COMPREHENSIVE METABOLIC PANEL - Abnormal; Notable for the following components:    Glucose, Bld 101 (*)    All other components within normal limits  URINALYSIS, ROUTINE W REFLEX MICROSCOPIC - Abnormal; Notable for the following components:   APPearance HAZY (*)    Leukocytes,Ua LARGE (*)    All other components within normal limits  CBC  TROPONIN I (HIGH SENSITIVITY)  TROPONIN I (HIGH SENSITIVITY)    EKG EKG Interpretation  Date/Time:  Thursday October 06 2021 01:09:49 EDT Ventricular Rate:  65 PR Interval:  148 QRS Duration: 78 QT Interval:  398 QTC Calculation: 413 R Axis:   77 Text Interpretation: Normal sinus rhythm Normal ECG When compared with ECG of 03-Aug-2014 15:49, PREVIOUS ECG IS PRESENT Confirmed by Gilda Crease [phone]) on 10/06/2021 4:28:26 AM  Radiology DG Chest 2 View  Result Date: 10/06/2021 CLINICAL DATA:  71 year old female with history of chest pain. EXAM: CHEST - 2 VIEW COMPARISON:  Chest x-ray 01/25/2021. FINDINGS: Lung volumes are normal. No consolidative airspace disease. No pleural effusions. No pneumothorax. No pulmonary nodule or mass noted. Pulmonary vasculature and the cardiomediastinal silhouette are within normal limits. IMPRESSION: No radiographic evidence of acute cardiopulmonary disease. Electronically Signed   By: Trudie Reed M.D.   On: 10/06/2021 05:41    Procedures Procedures    Medications Ordered in ED Medications - No data to display  ED Course/ Medical Decision Making/ A&P                           Medical Decision Making Amount and/or Complexity of Data Reviewed Labs: ordered. Radiology: ordered.   Patient presents to the emergency department for evaluation of chest and upper abdominal discomfort with nausea.  Differential diagnosis includes cardiac chest pain, gastritis, cholecystitis, colitis, diverticulitis.  Patient did have an extended wait in the waiting area prior to my evaluation.  By the time she arrived in the exam room, symptoms are resolved.  Patient indicates that the area where she was  experiencing discomfort is in the substernal/xiphoid area.  Abdominal exam is benign, nontender.  Lab work performed through triage for essentially normal.  She did have a nonspecific lipase of 65 but is not experiencing any left upper quadrant pain or tenderness.  No radiation to the back.  No reason for pancreatitis.  I did add on troponin which is negative.  EKG is normal.        Final Clinical Impression(s) / ED Diagnoses  Final diagnoses:  Chest pain, unspecified type    Rx / DC Orders ED Discharge Orders     None         Gilda Crease, MD 10/08/21 [phone]

## 2021-10-06 NOTE — ED Triage Notes (Signed)
Patient arrived with EMS from home reports pain across her upper abdomen with nausea this evening , she received Zofran 4 mg IV by EMS with relief.

## 2021-10-28 DIAGNOSIS — E785 Hyperlipidemia, unspecified: Secondary | ICD-10-CM | POA: Diagnosis not present

## 2021-10-28 DIAGNOSIS — B070 Plantar wart: Secondary | ICD-10-CM | POA: Diagnosis not present

## 2021-10-28 DIAGNOSIS — M8588 Other specified disorders of bone density and structure, other site: Secondary | ICD-10-CM | POA: Diagnosis not present

## 2021-10-28 DIAGNOSIS — M25562 Pain in left knee: Secondary | ICD-10-CM | POA: Diagnosis not present

## 2021-10-28 DIAGNOSIS — E559 Vitamin D deficiency, unspecified: Secondary | ICD-10-CM | POA: Diagnosis not present

## 2021-10-28 DIAGNOSIS — G3184 Mild cognitive impairment, so stated: Secondary | ICD-10-CM | POA: Diagnosis not present

## 2021-11-15 ENCOUNTER — Ambulatory Visit: Payer: Medicare Other

## 2021-11-23 ENCOUNTER — Ambulatory Visit: Payer: Medicare Other | Attending: Family Medicine | Admitting: Physical Therapy

## 2021-11-23 DIAGNOSIS — G8929 Other chronic pain: Secondary | ICD-10-CM | POA: Insufficient documentation

## 2021-11-23 DIAGNOSIS — M25561 Pain in right knee: Secondary | ICD-10-CM | POA: Insufficient documentation

## 2021-11-23 DIAGNOSIS — M6281 Muscle weakness (generalized): Secondary | ICD-10-CM | POA: Insufficient documentation

## 2021-11-29 NOTE — Therapy (Signed)
OUTPATIENT PHYSICAL THERAPY LOWER EXTREMITY EVALUATION   Patient Name: Caitlyn Fields MRN: [id number] DOB:Nov 14, 1950, 71 y.o., female Today's Date: 11/29/2021    Past Medical History:  Diagnosis Date   Depression    Headache(784.0)    Past Surgical History:  Procedure Laterality Date   CESAREAN SECTION     Patient Active Problem List   Diagnosis Date Noted   Hereditary and idiopathic peripheral neuropathy 03/08/2015    PCP: Leodis Sias  REFERRING PROVIDER: Jarrett Soho  REFERRING DIAG: [phone]   THERAPY DIAG:  No diagnosis found.  Rationale for Evaluation and Treatment Rehabilitation  ONSET DATE: 10/31/21  SUBJECTIVE:   SUBJECTIVE STATEMENT: I can walk a mile but that is about as far, but if I move certain ways my R knee hurts.Uneven terrain also bothers me.   PAIN:  Are you having pain? Yes: NPRS scale: 3/10 Pain location: R knee Pain description: split second sharp pain Aggravating factors: walking on uneven terrain, moving the wrong way Relieving factors: nothing  PRECAUTIONS: None  WEIGHT BEARING RESTRICTIONS No  FALLS:  Has patient fallen in last 6 months? No  LIVING ENVIRONMENT: Lives with: lives alone Lives in: House/apartment Stairs: No Has following equipment at home: None  OCCUPATION: retired  PLOF: Independent  PATIENT GOALS to learn which knee sleeves/braces to get, strengthen the muscles in my leg    OBJECTIVE:   DIAGNOSTIC FINDINGS: none for the knee  PATIENT SURVEYS:  FOTO 53/100  COGNITION:  Overall cognitive status: Within functional limits for tasks assessed     SENSATION: WFL  MUSCLE LENGTH: Hamstrings: moderate bilateral tightness, more tight on the R side    LOWER EXTREMITY ROM: WFL    LOWER EXTREMITY MMT:  MMT Right eval Left eval  Hip flexion 3+ 3+  Hip extension 4- 4-  Hip abduction 3+ 3+  Hip adduction    Hip internal rotation 4 4  Hip external rotation 4 4  Knee flexion 4 4  Knee  extension 4- 4  Ankle dorsiflexion 5 5  Ankle plantarflexion 5 5  Ankle inversion    Ankle eversion     (Blank rows = not tested)  FUNCTIONAL TESTS:  5 times sit to stand: 13.35s Timed up and go (TUG): 11.14s   TODAY'S TREATMENT: Eval and POC   PATIENT EDUCATION:  Education details: POC Person educated: Patient Education method: Explanation Education comprehension: verbalized understanding   HOME EXERCISE PROGRAM: TBD  ASSESSMENT:  CLINICAL IMPRESSION: Patient is a 71 y.o. female who was seen today for physical therapy evaluation and treatment for R knee pain. She arrived late to the appointment because she got lost. She is overall healthy and presents with decent range and strength. She states her knee pain comes on for a split second at random times depending on what she is doing. Most often it happens when walking on uneven terrain or while dancing. Patient presents with lots of tightness in hip rotators, with positive FABER test and in bilateral hamstrings. She has some weakness in her hips and would like to work on getting stronger overall. She will benefit from skilled PT intervention to address impairments to be able to walk longer distances and on uneven terrain without pain.   REHAB POTENTIAL: Good  CLINICAL DECISION MAKING: Stable/uncomplicated  EVALUATION COMPLEXITY: Low   GOALS: Goals reviewed with patient? Yes  SHORT TERM GOALS: Target date: 12/28/21  Patient will be independent with initial HEP. Goal status: INITIAL   LONG TERM GOALS: Target date: 02/01/22  Patient will be independent with advanced/ongoing HEP to improve outcomes and carryover.  Goal status: INITIAL  2.  Patient will report at least 75% improvement in R knee pain to improve QOL. Goal status: INITIAL  3.  Patient will demonstrate improved functional LE strength as demonstrated by >=4/5 with MMT. Goal status: INITIAL  4.  Patient will report 35 on FOTO to demonstrate improved  functional ability. Baseline: 53 Goal status: INITIAL   5.  Patient will be able to walk outdoors on uneven terrain and on inclines without pain in R knee.  Baseline: discomfort with uphill Goal status: INITIAL    PLAN: PT FREQUENCY: 1-2x/week  PT DURATION: 8 weeks  PLANNED INTERVENTIONS: Therapeutic exercises, Therapeutic activity, Neuromuscular re-education, Balance training, Gait training, Patient/Family education, Self Care, Joint mobilization, Stair training, Cryotherapy, Moist heat, Vasopneumatic device, Traction, Ionotophoresis 4mg /ml Dexamethasone, and Manual therapy  PLAN FOR NEXT SESSION: start gym activities   [place], PT 11/29/2021, 11:25 AM

## 2021-11-30 ENCOUNTER — Ambulatory Visit: Payer: Medicare Other

## 2021-11-30 DIAGNOSIS — G8929 Other chronic pain: Secondary | ICD-10-CM

## 2021-11-30 DIAGNOSIS — M6281 Muscle weakness (generalized): Secondary | ICD-10-CM | POA: Diagnosis present

## 2021-11-30 DIAGNOSIS — M25561 Pain in right knee: Secondary | ICD-10-CM | POA: Diagnosis present

## 2021-12-02 ENCOUNTER — Ambulatory Visit: Payer: Self-pay

## 2021-12-02 ENCOUNTER — Ambulatory Visit: Payer: Medicare Other | Admitting: Physical Therapy

## 2021-12-02 ENCOUNTER — Encounter: Payer: Self-pay | Admitting: Physical Therapy

## 2021-12-02 DIAGNOSIS — M6281 Muscle weakness (generalized): Secondary | ICD-10-CM

## 2021-12-02 DIAGNOSIS — G8929 Other chronic pain: Secondary | ICD-10-CM

## 2021-12-02 IMAGING — CR DG LUMBAR SPINE COMPLETE 4+V
5 series · 5 of 5 positions shown · non-contrast
Comparison: 08/21/2012.

CLINICAL DATA: History of pain.  No known injury.

EXAM:
LUMBAR SPINE - COMPLETE 4+ VIEW

[w lumbar spine ap]
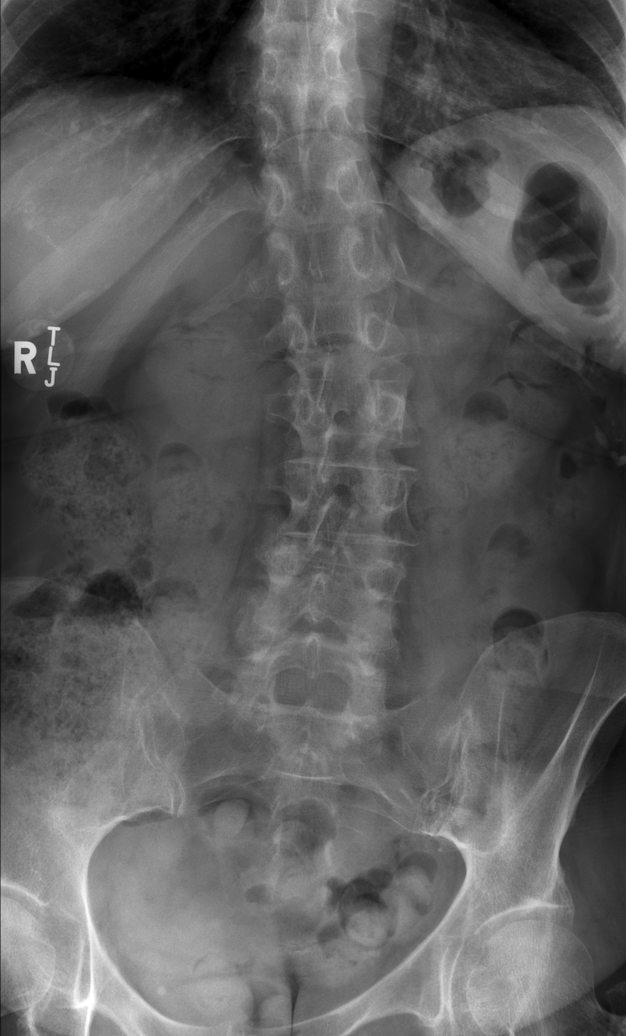

[w lumbar spine obl (1 of 2)]
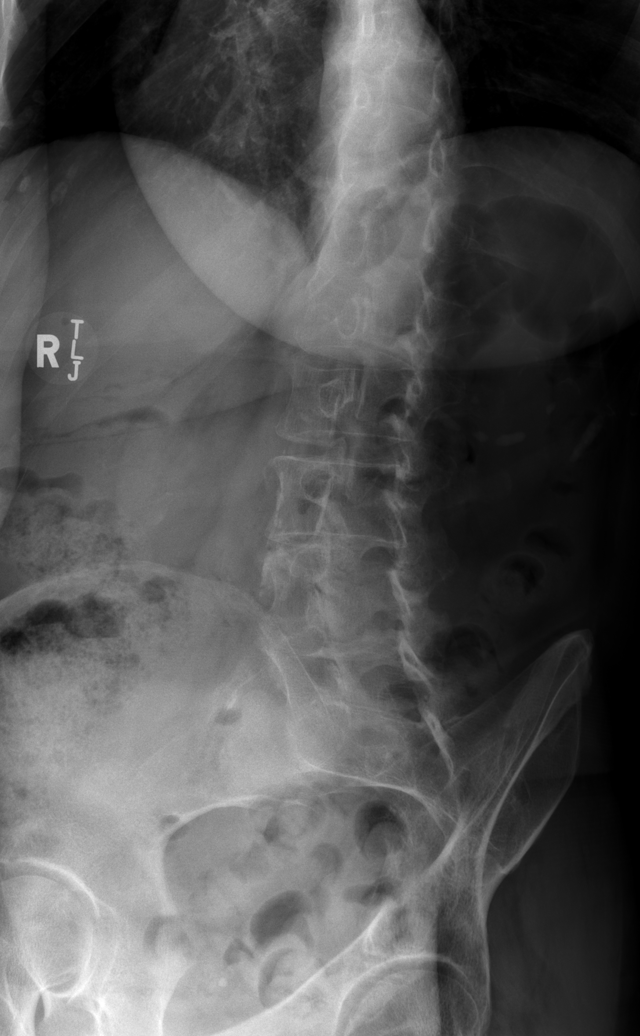

[w lumbar spine obl (2 of 2)]
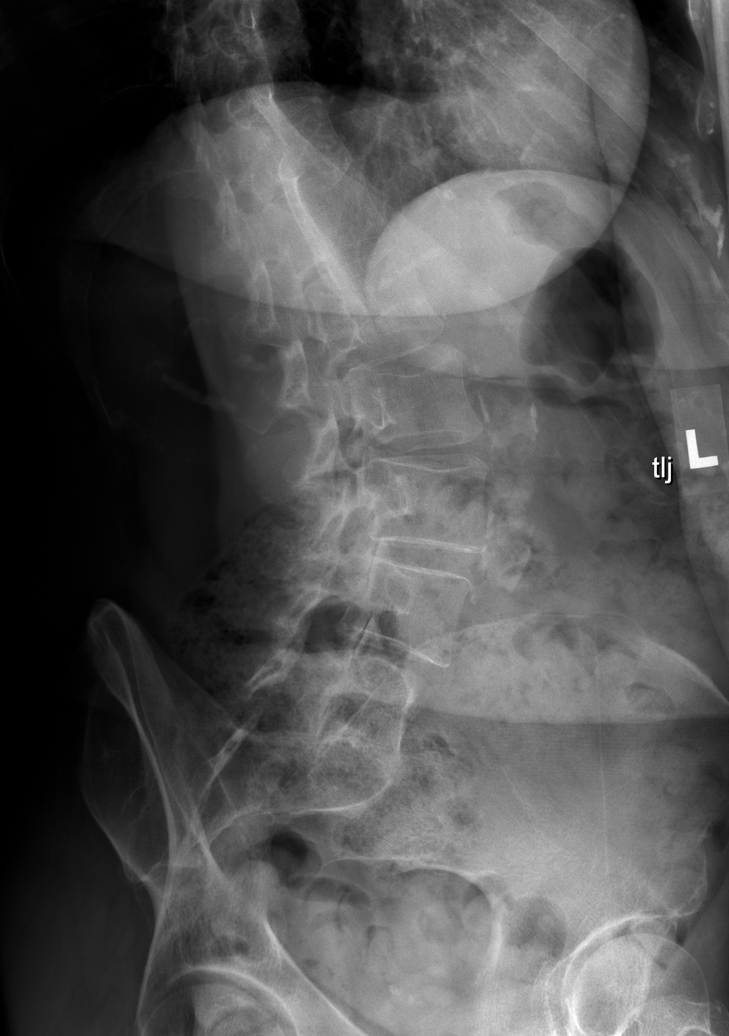

[w lumbar spine lat]
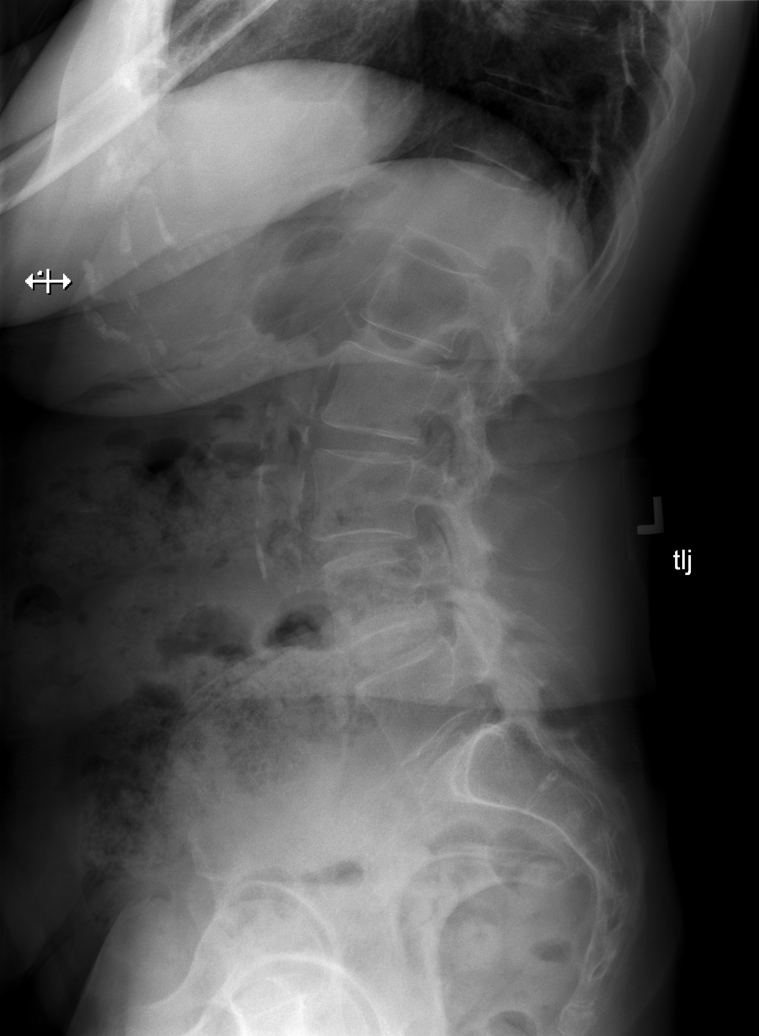

[w lumbar l-5 s-1 spot]
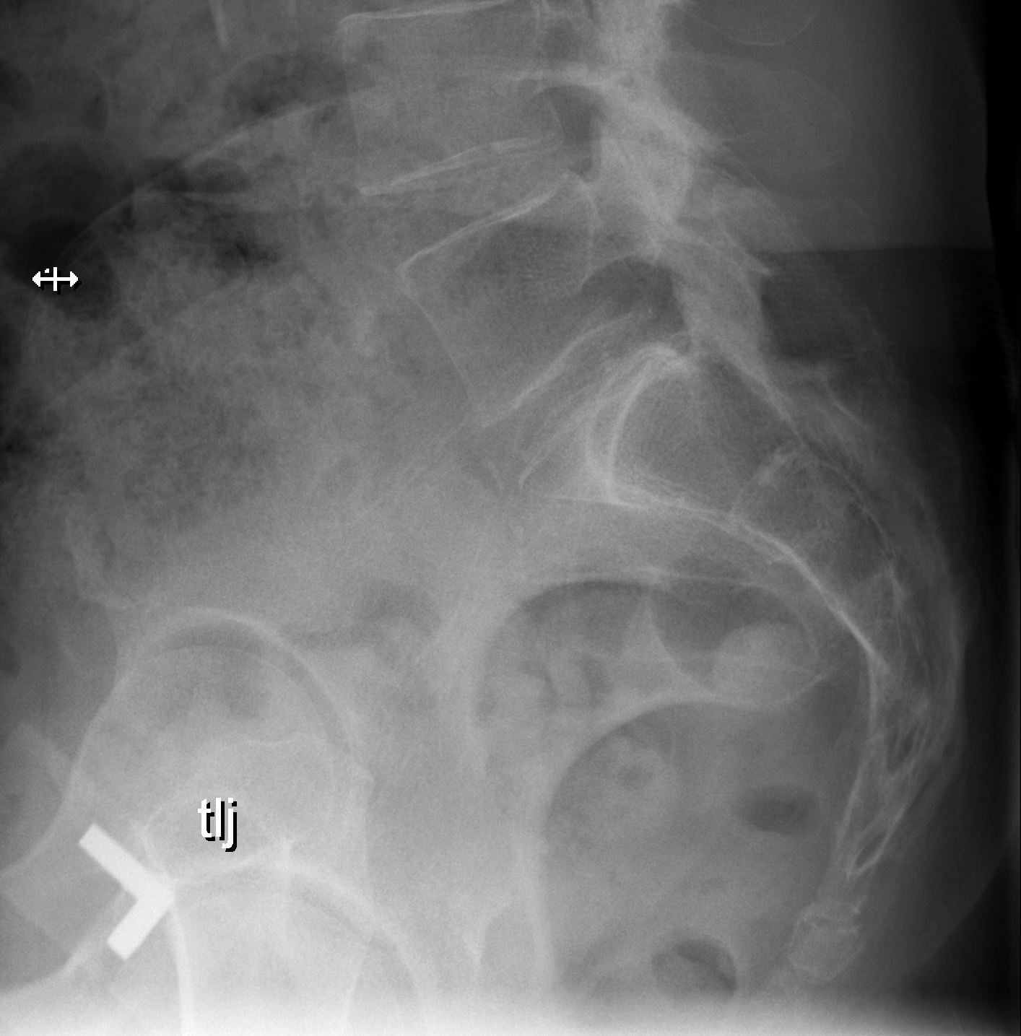

[5 of 5 positions shown; findings below may reference images not displayed]

FINDINGS: Diffuse osteopenia. Lumbar spine scoliosis concave right again
noted. Mild diffuse degenerative change. No acute bony abnormality.
No evidence of fracture. Aortic atherosclerotic vascular
calcification. Pelvic calcifications consistent phleboliths.
IMPRESSION: 1. Diffuse osteopenia. Lumbar spine scoliosis concave right again
noted. Mild diffuse degenerative change. No acute bony abnormality.

2.  Aortic atherosclerotic vascular disease.

## 2021-12-02 NOTE — Therapy (Signed)
OUTPATIENT PHYSICAL THERAPY LOWER EXTREMITY EVALUATION   Patient Name: Caitlyn Fields MRN: [id number] DOB:1950-09-19, 71 y.o., female Today's Date: 12/02/2021    Past Medical History:  Diagnosis Date   Depression    Headache(784.0)    Past Surgical History:  Procedure Laterality Date   CESAREAN SECTION     Patient Active Problem List   Diagnosis Date Noted   Hereditary and idiopathic peripheral neuropathy 03/08/2015    PCP: Leodis Sias  REFERRING PROVIDER: Jarrett Soho  REFERRING DIAG: [phone]   THERAPY DIAG:  No diagnosis found.  Rationale for Evaluation and Treatment Rehabilitation  ONSET DATE: 10/31/21  SUBJECTIVE:   SUBJECTIVE STATEMENT: Patient reports no real pain, still having flashes. She arrived a bit late this morning for treatment.  PAIN:  Are you having pain? Yes: NPRS scale: 3/10 Pain location: R knee Pain description: split second sharp pain Aggravating factors: walking on uneven terrain, moving the wrong way Relieving factors: nothing  PRECAUTIONS: None  WEIGHT BEARING RESTRICTIONS No  FALLS:  Has patient fallen in last 6 months? No  LIVING ENVIRONMENT: Lives with: lives alone Lives in: House/apartment Stairs: No Has following equipment at home: None  OCCUPATION: retired  PLOF: Independent  PATIENT GOALS to learn which knee sleeves/braces to get, strengthen the muscles in my leg    OBJECTIVE:   DIAGNOSTIC FINDINGS: none for the knee  PATIENT SURVEYS:  FOTO 53/100  COGNITION:  Overall cognitive status: Within functional limits for tasks assessed     SENSATION: WFL  MUSCLE LENGTH: Hamstrings: moderate bilateral tightness, more tight on the R side    LOWER EXTREMITY ROM: WFL    LOWER EXTREMITY MMT:  MMT Right eval Left eval  Hip flexion 3+ 3+  Hip extension 4- 4-  Hip abduction 3+ 3+  Hip adduction    Hip internal rotation 4 4  Hip external rotation 4 4  Knee flexion 4 4  Knee extension 4- 4   Ankle dorsiflexion 5 5  Ankle plantarflexion 5 5  Ankle inversion    Ankle eversion     (Blank rows = not tested)  FUNCTIONAL TESTS:  5 times sit to stand: 13.35s Timed up and go (TUG): 11.14s   TODAY'S TREATMENT: 12/02/21 NuStep L4 x 5 minutes Knee flexion 20#, BLE x 10 Knee Ext-tried 5#, but patient felt pain, so deferred Supine stretch-HS, figure 4,  Supine strength-bridge, clamshells Mini squats Side to side stepping with resistance at knees    Eval and POC   PATIENT EDUCATION:  Education details: HEP Person educated: Patient Education method: Explanation Education comprehension: verbalized understanding   HOME EXERCISE PROGRAM:  [id number]  ASSESSMENT:  CLINICAL IMPRESSION: Patient reports no issues. Treatment addressed strength and stretching, established HEP.  REHAB POTENTIAL: Good  CLINICAL DECISION MAKING: Stable/uncomplicated  EVALUATION COMPLEXITY: Low   GOALS: Goals reviewed with patient? Yes  SHORT TERM GOALS: Target date: 12/28/21  Patient will be independent with initial HEP. Goal status: ongoing   LONG TERM GOALS: Target date: 02/01/22  Patient will be independent with advanced/ongoing HEP to improve outcomes and carryover.  Goal status: INITIAL  2.  Patient will report at least 75% improvement in R knee pain to improve QOL. Goal status: INITIAL  3.  Patient will demonstrate improved functional LE strength as demonstrated by >=4/5 with MMT. Goal status: INITIAL  4.  Patient will report 28 on FOTO to demonstrate improved functional ability. Baseline: 53 Goal status: INITIAL   5.  Patient will be able to walk outdoors  on uneven terrain and on inclines without pain in R knee.  Baseline: discomfort with uphill Goal status: INITIAL    PLAN: PT FREQUENCY: 1-2x/week  PT DURATION: 8 weeks  PLANNED INTERVENTIONS: Therapeutic exercises, Therapeutic activity, Neuromuscular re-education, Balance training, Gait training,  Patient/Family education, Self Care, Joint mobilization, Stair training, Cryotherapy, Moist heat, Vasopneumatic device, Traction, Ionotophoresis 4mg /ml Dexamethasone, and Manual therapy  PLAN FOR NEXT SESSION: How did HEP go?   , DPT 12/02/2021, 11:10 AM

## 2021-12-02 NOTE — Patient Outreach (Signed)
  Care Coordination   Initial Visit Note   12/02/2021 Name: Caitlyn Fields MRN: [id number] DOB: January 05, 1951  Caitlyn Fields is a 71 y.o. year old female who sees Jarrett Soho, [place] for primary care. I {TYPECCVISIT:27766}  What matters to the patients health and wellness today?  ***    Goals Addressed             This Visit's Progress    Care Coordination Activities       Care Coordination Interventions:         SDOH assessments and interventions completed:  {yes/no:20286}{THN Tip this will not be part of the note when signed-REQUIRED REPORT FIELD DO NOT DELETE (Optional):27901}  SDOH Interventions Today    Flowsheet Row Most Recent Value  SDOH Interventions   Food Insecurity Interventions Intervention Not Indicated  Transportation Interventions Intervention Not Indicated        Care Coordination Interventions Activated:  {CCYES/NO:27769} {THN Tip this will not be part of the note when signed-REQUIRED REPORT FIELD DO NOT DELETE (Optional):27901} Care Coordination Interventions:  {INTERVENTIONS:27767} {THN Tip this will not be part of the note when signed-REQUIRED REPORT FIELD DO NOT DELETE (Optional):27901}  Follow up plan: {CCFOLLOWUP:27768}   Encounter Outcome:  {ENCOUTCOME:27770} {THN Tip this will not be part of the note when signed-REQUIRED REPORT FIELD DO NOT DELETE (Optional):27901}

## 2021-12-06 ENCOUNTER — Ambulatory Visit: Payer: Medicare Other | Admitting: Physical Therapy

## 2021-12-06 DIAGNOSIS — G8929 Other chronic pain: Secondary | ICD-10-CM

## 2021-12-06 DIAGNOSIS — M6281 Muscle weakness (generalized): Secondary | ICD-10-CM

## 2021-12-06 NOTE — Therapy (Signed)
OUTPATIENT PHYSICAL THERAPY LOWER EXTREMITY  Patient Name: Caitlyn Fields MRN: [id number] DOB:01/01/1951, 71 y.o., female Today's Date: 12/06/2021   PT End of Session - 12/06/21 0920     Visit Number 3    Date for PT Re-Evaluation 02/01/22    Authorization Type [employer] Medicare    PT Start Time 0925    PT Stop Time 1010    PT Time Calculation (min) 45 min             Past Medical History:  Diagnosis Date   Depression    Headache(784.0)    Past Surgical History:  Procedure Laterality Date   CESAREAN SECTION     Patient Active Problem List   Diagnosis Date Noted   Hereditary and idiopathic peripheral neuropathy 03/08/2015    PCP: Leodis Sias  REFERRING PROVIDER: Jarrett Soho  REFERRING DIAG: [phone]   THERAPY DIAG:  Muscle weakness (generalized)  Chronic pain of right knee  Rationale for Evaluation and Treatment Rehabilitation  ONSET DATE: 10/31/21  SUBJECTIVE:   SUBJECTIVE STATEMENT: no pain at the moment , busy so not a chance to do HEP PAIN:  Are you having pain? No  PRECAUTIONS: None  WEIGHT BEARING RESTRICTIONS No  FALLS:  Has patient fallen in last 6 months? No  LIVING ENVIRONMENT: Lives with: lives alone Lives in: House/apartment Stairs: No Has following equipment at home: None  OCCUPATION: retired  PLOF: Independent  PATIENT GOALS to learn which knee sleeves/braces to get, strengthen the muscles in my leg    OBJECTIVE:   DIAGNOSTIC FINDINGS: none for the knee  PATIENT SURVEYS:  FOTO 53/100  COGNITION:  Overall cognitive status: Within functional limits for tasks assessed     SENSATION: WFL  MUSCLE LENGTH: Hamstrings: moderate bilateral tightness, more tight on the R side    LOWER EXTREMITY ROM: WFL    LOWER EXTREMITY MMT:  MMT Right eval Left eval  Hip flexion 3+ 3+  Hip extension 4- 4-  Hip abduction 3+ 3+  Hip adduction    Hip internal rotation 4 4  Hip external rotation 4 4  Knee flexion 4 4  Knee  extension 4- 4  Ankle dorsiflexion 5 5  Ankle plantarflexion 5 5  Ankle inversion    Ankle eversion     (Blank rows = not tested)  FUNCTIONAL TESTS:  5 times sit to stand: 13.35s Timed up and go (TUG): 11.14s   TODAY'S TREATMENT:  12/06/21  Nustep L 4 6 min LE only STS on airex 2 sets 10- cued to control eccentric LAQ 3# 2 sets 10- cued for full TKE ADD ball squeeze 2 sets 10 Hip abd green tband 2 sets 10 Hip flex green tband 2 sets 10 Leg Press feet 3 way 12 x each 20# HS curl seated green tband 2 sets 10 Resisted gait 4 way 4 x 20 #     12/02/21 NuStep L4 x 5 minutes Knee flexion 20#, BLE x 10 Knee Ext-tried 5#, but patient felt pain, so deferred Supine stretch-HS, figure 4,  Supine strength-bridge, clamshells Mini squats Side to side stepping with resistance at knees    Eval and POC   PATIENT EDUCATION:  Education details: HEP Person educated: Patient Education method: Explanation Education comprehension: verbalized understanding   HOME EXERCISE PROGRAM:  [id number]  ASSESSMENT:  CLINICAL IMPRESSION: Pt arrived stating been too busy to try HEP. States pain very intermittent and comes and go quick.Progressed ex in clinic with cuing for ROM and control of mvmt.  Pt with multi questions about doing these ex at home and suggested she do initial HEP before we progress. REHAB POTENTIAL: Good  CLINICAL DECISION MAKING: Stable/uncomplicated  EVALUATION COMPLEXITY: Low   GOALS: Goals reviewed with patient? Yes  SHORT TERM GOALS: Target date: 12/28/21  Patient will be independent with initial HEP. Goal status: ongoing   LONG TERM GOALS: Target date: 02/01/22  Patient will be independent with advanced/ongoing HEP to improve outcomes and carryover.  Goal status: INITIAL  2.  Patient will report at least 75% improvement in R knee pain to improve QOL. Goal status: INITIAL  3.  Patient will demonstrate improved functional LE strength as demonstrated by  >=4/5 with MMT. Goal status: INITIAL  4.  Patient will report 31 on FOTO to demonstrate improved functional ability. Baseline: 53 Goal status: INITIAL   5.  Patient will be able to walk outdoors on uneven terrain and on inclines without pain in R knee.  Baseline: discomfort with uphill Goal status: INITIAL    PLAN: PT FREQUENCY: 1-2x/week  PT DURATION: 8 weeks  PLANNED INTERVENTIONS: Therapeutic exercises, Therapeutic activity, Neuromuscular re-education, Balance training, Gait training, Patient/Family education, Self Care, Joint mobilization, Stair training, Cryotherapy, Moist heat, Vasopneumatic device, Traction, Ionotophoresis 4mg /ml Dexamethasone, and Manual therapy  PLAN FOR NEXT SESSION: How did HEP go? Progress   , PTA 12/06/2021, 9:21 AM [facility] [place] [facility] [address] [place], [place], [place] Phone: [phone]   Fax:  [phone]  Patient Details  Name: Caitlyn Fields MRN: Alvan Dame Date of Birth: 10/18/1950 Referring Provider:  14/12/1950, PA-C  Encounter Date: 12/06/2021   12/08/2021, PTA 12/06/2021, 9:21 AM  [facility] [place] [facility] [address] [place], [place], [place] Phone: [phone]   Fax:  [phone]

## 2021-12-08 ENCOUNTER — Ambulatory Visit: Payer: Medicare Other | Admitting: Physical Therapy

## 2021-12-13 ENCOUNTER — Ambulatory Visit: Payer: Medicare Other | Attending: Family Medicine | Admitting: Physical Therapy

## 2021-12-13 ENCOUNTER — Encounter: Payer: Self-pay | Admitting: Physical Therapy

## 2021-12-13 DIAGNOSIS — M6281 Muscle weakness (generalized): Secondary | ICD-10-CM | POA: Insufficient documentation

## 2021-12-13 DIAGNOSIS — G8929 Other chronic pain: Secondary | ICD-10-CM | POA: Insufficient documentation

## 2021-12-13 DIAGNOSIS — M25561 Pain in right knee: Secondary | ICD-10-CM | POA: Diagnosis not present

## 2021-12-13 NOTE — Therapy (Signed)
OUTPATIENT PHYSICAL THERAPY LOWER EXTREMITY  Patient Name: Caitlyn Fields MRN: [id number] DOB:Aug 14, 1950, 71 y.o., female Today's Date: 12/13/2021   PT End of Session - 12/13/21 1148     Visit Number 4    Date for PT Re-Evaluation 02/01/22    Authorization Type UHC Medicare    PT Start Time 1145    PT Stop Time 1225    PT Time Calculation (min) 40 min    Activity Tolerance Patient tolerated treatment well    Behavior During Therapy [facility] for tasks assessed/performed             Past Medical History:  Diagnosis Date   Depression    Headache(784.0)    Past Surgical History:  Procedure Laterality Date   CESAREAN SECTION     Patient Active Problem List   Diagnosis Date Noted   Hereditary and idiopathic peripheral neuropathy 03/08/2015    PCP: Leodis Sias  REFERRING PROVIDER: Jarrett Soho  REFERRING DIAG: [phone]   THERAPY DIAG:  Muscle weakness (generalized)  Chronic pain of right knee  Rationale for Evaluation and Treatment Rehabilitation  ONSET DATE: 10/31/21  SUBJECTIVE:   SUBJECTIVE STATEMENT:patient reports lots of concern following last visit and with exercises. She states that her body is not used to "intense exercises" and that she shut down after last visit.  Are you having pain? No  PRECAUTIONS: None  WEIGHT BEARING RESTRICTIONS No  FALLS:  Has patient fallen in last 6 months? No  LIVING ENVIRONMENT: Lives with: lives alone Lives in: House/apartment Stairs: No Has following equipment at home: None  OCCUPATION: retired  PLOF: Independent  PATIENT GOALS to learn which knee sleeves/braces to get, strengthen the muscles in my leg    OBJECTIVE:   DIAGNOSTIC FINDINGS: none for the knee  PATIENT SURVEYS:  FOTO 53/100  COGNITION:  Overall cognitive status: Within functional limits for tasks assessed     SENSATION: WFL  MUSCLE LENGTH: Hamstrings: moderate bilateral tightness, more tight on the R side    LOWER EXTREMITY  ROM: WFL    LOWER EXTREMITY MMT:  MMT Right eval Left eval  Hip flexion 3+ 3+  Hip extension 4- 4-  Hip abduction 3+ 3+  Hip adduction    Hip internal rotation 4 4  Hip external rotation 4 4  Knee flexion 4 4  Knee extension 4- 4  Ankle dorsiflexion 5 5  Ankle plantarflexion 5 5  Ankle inversion    Ankle eversion     (Blank rows = not tested)  FUNCTIONAL TESTS:  5 times sit to stand: 13.35s Timed up and go (TUG): 11.14s   TODAY'S TREATMENT: 12/13/21 Walking outdoors, big lap  Nustep L5 x5 Step ups 6"  Lateral step ups 6"  2 way hip 2# 2x10  Leg ext 5# 2x5 HS curls 10# 2x10   12/06/21 Nustep L 4 6 min LE only STS on airex 2 sets 10- cued to control eccentric LAQ 3# 2 sets 10- cued for full TKE ADD ball squeeze 2 sets 10 Hip abd green tband 2 sets 10 Hip flex green tband 2 sets 10 Leg Press feet 3 way 12 x each 20# HS curl seated green tband 2 sets 10 Resisted gait 4 way 4 x 20 #     12/02/21 NuStep L4 x 5 minutes Knee flexion 20#, BLE x 10 Knee Ext-tried 5#, but patient felt pain, so deferred Supine stretch-HS, figure 4,  Supine strength-bridge, clamshells Mini squats Side to side stepping with resistance at knees  Eval and POC   PATIENT EDUCATION:  Education details: HEP Person educated: Patient Education method: Explanation Education comprehension: verbalized understanding   HOME EXERCISE PROGRAM:  [id number]  ASSESSMENT:  CLINICAL IMPRESSION: Patient had some concerns with exercises and it straining her knees. Therefore today, we did low level weights and body weight supported exercises. Patient was educated that soreness is normal after exercise and will resolve after a few days.   REHAB POTENTIAL: Good  CLINICAL DECISION MAKING: Stable/uncomplicated  EVALUATION COMPLEXITY: Low   GOALS: Goals reviewed with patient? Yes  SHORT TERM GOALS: Target date: 12/28/21  Patient will be independent with initial HEP. Goal status:  ongoing   LONG TERM GOALS: Target date: 02/01/22  Patient will be independent with advanced/ongoing HEP to improve outcomes and carryover.  Goal status: INITIAL  2.  Patient will report at least 75% improvement in R knee pain to improve QOL. Goal status: INITIAL  3.  Patient will demonstrate improved functional LE strength as demonstrated by >=4/5 with MMT. Goal status: INITIAL  4.  Patient will report 57 on FOTO to demonstrate improved functional ability. Baseline: 53 Goal status: INITIAL   5.  Patient will be able to walk outdoors on uneven terrain and on inclines without pain in R knee.  Baseline: discomfort with uphill Goal status: INITIAL    PLAN: PT FREQUENCY: 1-2x/week  PT DURATION: 8 weeks  PLANNED INTERVENTIONS: Therapeutic exercises, Therapeutic activity, Neuromuscular re-education, Balance training, Gait training, Patient/Family education, Self Care, Joint mobilization, Stair training, Cryotherapy, Moist heat, Vasopneumatic device, Traction, Ionotophoresis 4mg /ml Dexamethasone, and Manual therapy  PLAN FOR NEXT SESSION: How did HEP go? Progress   , PTA 12/13/2021, 12:26 PM [facility] [place] [facility] [address] [place], [place], [place] Phone: [phone]   Fax:  [phone]  Patient Details  Name: Caitlyn Fields MRN: Alvan Dame Date of Birth: 02-May-1950 Referring Provider:  14/12/1950, PA-C  Encounter Date: 12/13/2021   02/12/2022, PT 12/13/2021, 12:26 PM  [facility] [place] [facility] [address] [place], [place], [place] Phone: [phone]   Fax:  [phone]

## 2021-12-15 ENCOUNTER — Encounter: Payer: Self-pay | Admitting: Physical Therapy

## 2021-12-15 ENCOUNTER — Ambulatory Visit: Payer: Medicare Other | Admitting: Physical Therapy

## 2021-12-15 DIAGNOSIS — M6281 Muscle weakness (generalized): Secondary | ICD-10-CM

## 2021-12-15 DIAGNOSIS — M25561 Pain in right knee: Secondary | ICD-10-CM | POA: Diagnosis not present

## 2021-12-15 DIAGNOSIS — G8929 Other chronic pain: Secondary | ICD-10-CM

## 2021-12-15 NOTE — Therapy (Signed)
OUTPATIENT PHYSICAL THERAPY LOWER EXTREMITY  Patient Name: Caitlyn Fields MRN: [id number] DOB:01/02/1951, 71 y.o., female Today's Date: 12/15/2021   PT End of Session - 12/15/21 1145     Visit Number 5    Date for PT Re-Evaluation 02/01/22    PT Start Time 1145    PT Stop Time 1218    PT Time Calculation (min) 33 min    Activity Tolerance Patient tolerated treatment well    Behavior During Therapy [facility] for tasks assessed/performed             Past Medical History:  Diagnosis Date   Depression    Headache(784.0)    Past Surgical History:  Procedure Laterality Date   CESAREAN SECTION     Patient Active Problem List   Diagnosis Date Noted   Hereditary and idiopathic peripheral neuropathy 03/08/2015    PCP: Leodis Sias  REFERRING PROVIDER: Jarrett Soho  REFERRING DIAG: [phone]   THERAPY DIAG:  Muscle weakness (generalized)  Chronic pain of right knee  Rationale for Evaluation and Treatment Rehabilitation  ONSET DATE: 10/31/21  SUBJECTIVE:   SUBJECTIVE STATEMENT: Was not exhausted the next day after last session. "I feel fine" Just walked a mile"   Are you having pain? No  PRECAUTIONS: None  WEIGHT BEARING RESTRICTIONS No  FALLS:  Has patient fallen in last 6 months? No  LIVING ENVIRONMENT: Lives with: lives alone Lives in: House/apartment Stairs: No Has following equipment at home: None  OCCUPATION: retired  PLOF: Independent  PATIENT GOALS to learn which knee sleeves/braces to get, strengthen the muscles in my leg    OBJECTIVE:   DIAGNOSTIC FINDINGS: none for the knee  PATIENT SURVEYS:  FOTO 53/100  COGNITION:  Overall cognitive status: Within functional limits for tasks assessed     SENSATION: WFL  MUSCLE LENGTH: Hamstrings: moderate bilateral tightness, more tight on the R side    LOWER EXTREMITY ROM: WFL    LOWER EXTREMITY MMT:  MMT Right eval Left eval  Hip flexion 3+ 3+  Hip extension 4- 4-  Hip abduction  3+ 3+  Hip adduction    Hip internal rotation 4 4  Hip external rotation 4 4  Knee flexion 4 4  Knee extension 4- 4  Ankle dorsiflexion 5 5  Ankle plantarflexion 5 5  Ankle inversion    Ankle eversion     (Blank rows = not tested)  FUNCTIONAL TESTS:  5 times sit to stand: 13.35s Timed up and go (TUG): 11.14s   TODAY'S TREATMENT: 12/15/21 HS curls 10lb 2x10 Unable to complete machine leg Ext, LAQ 1lb 2x10 each, stopped at 7 on second set with RLE 10lb side step (5 each side) x3 each, LOB x1  Step ups 4 in x 10 each Lateral 4 in x 10 each    12/13/21 Walking outdoors, big lap  Nustep L5 x5 Step ups 6"  Lateral step ups 6"  2 way hip 2# 2x10  Leg ext 5# 2x5 HS curls 10# 2x10   12/06/21 Nustep L 4 6 min LE only STS on airex 2 sets 10- cued to control eccentric LAQ 3# 2 sets 10- cued for full TKE ADD ball squeeze 2 sets 10 Hip abd green tband 2 sets 10 Hip flex green tband 2 sets 10 Leg Press feet 3 way 12 x each 20# HS curl seated green tband 2 sets 10 Resisted gait 4 way 4 x 20 #     12/02/21 NuStep L4 x 5 minutes Knee flexion  20#, BLE x 10 Knee Ext-tried 5#, but patient felt pain, so deferred Supine stretch-HS, figure 4,  Supine strength-bridge, clamshells Mini squats Side to side stepping with resistance at knees    Eval and POC   PATIENT EDUCATION:  Education details: HEP Person educated: Patient Education method: Explanation Education comprehension: verbalized understanding   HOME EXERCISE PROGRAM:  [id number]  ASSESSMENT:  CLINICAL IMPRESSION: Pt enters reporting that she walked a mile before therapy, so session backed down due too cumulative fatigue. Added resisted side step with some instability. Selective lower height box to to aid with potential sourness and fatigue tomorrow. PT completed LAQ instead of leg ext on machine.  REHAB POTENTIAL: Good  CLINICAL DECISION MAKING: Stable/uncomplicated  EVALUATION COMPLEXITY:  Low   GOALS: Goals reviewed with patient? Yes  SHORT TERM GOALS: Target date: 12/28/21  Patient will be independent with initial HEP. Goal status: ongoing   LONG TERM GOALS: Target date: 02/01/22  Patient will be independent with advanced/ongoing HEP to improve outcomes and carryover.  Goal status: INITIAL  2.  Patient will report at least 75% improvement in R knee pain to improve QOL. Goal status: INITIAL  3.  Patient will demonstrate improved functional LE strength as demonstrated by >=4/5 with MMT. Goal status: INITIAL  4.  Patient will report 74 on FOTO to demonstrate improved functional ability. Baseline: 53 Goal status: INITIAL   5.  Patient will be able to walk outdoors on uneven terrain and on inclines without pain in R knee.  Baseline: discomfort with uphill Goal status: INITIAL    PLAN: PT FREQUENCY: 1-2x/week  PT DURATION: 8 weeks  PLANNED INTERVENTIONS: Therapeutic exercises, Therapeutic activity, Neuromuscular re-education, Balance training, Gait training, Patient/Family education, Self Care, Joint mobilization, Stair training, Cryotherapy, Moist heat, Vasopneumatic device, Traction, Ionotophoresis 4mg /ml Dexamethasone, and Manual therapy  PLAN FOR NEXT SESSION: How did HEP go? Progress   , PTA 12/15/2021, 12:19 PM [facility] [place] [facility] [address] [place], [place], [place] Phone: [phone]   Fax:  [phone]  Patient Details  Name: CHAR FELTMAN MRN: Alvan Dame Date of Birth: 1950/10/26 Referring Provider:  14/12/1950, PA-C  Encounter Date: 12/15/2021   02/14/2022, PTA 12/15/2021, 12:19 PM  [facility] [place] [facility] [address] [place], [place], [place] Phone: [phone]   Fax:  [phone]

## 2021-12-20 ENCOUNTER — Ambulatory Visit: Payer: Medicare Other | Admitting: Physical Therapy

## 2021-12-21 NOTE — Therapy (Signed)
OUTPATIENT PHYSICAL THERAPY LOWER EXTREMITY  Patient Name: Caitlyn Fields MRN: [id number] DOB:Sep 05, 1950, 71 y.o., female Today's Date: 12/22/2021   PT End of Session - 12/22/21 1145     Visit Number 6    Date for PT Re-Evaluation 02/01/22    PT Start Time 1145    PT Stop Time 1227    PT Time Calculation (min) 42 min    Activity Tolerance Patient tolerated treatment well    Behavior During Therapy [facility] for tasks assessed/performed              Past Medical History:  Diagnosis Date   Depression    Headache(784.0)    Past Surgical History:  Procedure Laterality Date   CESAREAN SECTION     Patient Active Problem List   Diagnosis Date Noted   Hereditary and idiopathic peripheral neuropathy 03/08/2015    PCP: Yaakov Guthrie  REFERRING PROVIDER: [name]  REFERRING DIAG: [phone]   THERAPY DIAG:  Muscle weakness (generalized)  Chronic pain of right knee  Rationale for Evaluation and Treatment Rehabilitation  ONSET DATE: 10/31/21  SUBJECTIVE:   SUBJECTIVE STATEMENT: I am doing surprisingly better than I thought I would  Are you having pain? No  PRECAUTIONS: None  WEIGHT BEARING RESTRICTIONS No  FALLS:  Has patient fallen in last 6 months? No  LIVING ENVIRONMENT: Lives with: lives alone Lives in: House/apartment Stairs: No Has following equipment at home: None  OCCUPATION: retired  PLOF: Independent  PATIENT GOALS to learn which knee sleeves/braces to get, strengthen the muscles in my leg    OBJECTIVE:   DIAGNOSTIC FINDINGS: none for the knee  PATIENT SURVEYS:  FOTO 53/100  COGNITION:  Overall cognitive status: Within functional limits for tasks assessed     SENSATION: WFL  MUSCLE LENGTH: Hamstrings: moderate bilateral tightness, more tight on the R side    LOWER EXTREMITY ROM: WFL    LOWER EXTREMITY MMT:  MMT Right eval Left eval  Hip flexion 3+ 3+  Hip extension 4- 4-  Hip abduction 3+ 3+  Hip adduction    Hip  internal rotation 4 4  Hip external rotation 4 4  Knee flexion 4 4  Knee extension 4- 4  Ankle dorsiflexion 5 5  Ankle plantarflexion 5 5  Ankle inversion    Ankle eversion     (Blank rows = not tested)  FUNCTIONAL TESTS:  5 times sit to stand: 13.35s Timed up and go (TUG): 11.14s   TODAY'S TREATMENT: 12/22/21 Walking outdoors  Bike L2 x33mns  LAQ 2.5# 2x10 HS curls 10# 2x10  SLR 2# x10  Clamshells greenTB 2x10 Bridges 2x10  12/15/21 HS curls 10lb 2x10 Unable to complete machine leg Ext, LAQ 1lb 2x10 each, stopped at 7 on second set with RLE 10lb side step (5 each side) x3 each, LOB x1  Step ups 4 in x 10 each Lateral 4 in x 10 each    12/13/21 Walking outdoors, big lap  Nustep L5 x5 Step ups 6"  Lateral step ups 6"  2 way hip 2# 2x10  Leg ext 5# 2x5 HS curls 10# 2x10   12/06/21 Nustep L 4 6 min LE only STS on airex 2 sets 10- cued to control eccentric LAQ 3# 2 sets 10- cued for full TKE ADD ball squeeze 2 sets 10 Hip abd green tband 2 sets 10 Hip flex green tband 2 sets 10 Leg Press feet 3 way 12 x each 20# HS curl seated green tband 2 sets 10  Resisted gait 4 way 4 x 20 #     12/02/21 NuStep L4 x 5 minutes Knee flexion 20#, BLE x 10 Knee Ext-tried 5#, but patient felt pain, so deferred Supine stretch-HS, figure 4,  Supine strength-bridge, clamshells Mini squats Side to side stepping with resistance at knees    Eval and POC   PATIENT EDUCATION:  Education details: HEP Person educated: Patient Education method: Explanation Education comprehension: verbalized understanding   HOME EXERCISE PROGRAM:  [id number]  ASSESSMENT:  CLINICAL IMPRESSION: Patient states she is doing better, still having some knee pain with "certain movements". Reports her R knee pain is 85% better. Still does not tolerate weights well, and reports it makes her feel too exhausted and sore. Patient complains that SLR is too hard and can only complete 10 reps.   REHAB  POTENTIAL: Good  CLINICAL DECISION MAKING: Stable/uncomplicated  EVALUATION COMPLEXITY: Low   GOALS: Goals reviewed with patient? Yes  SHORT TERM GOALS: Target date: 12/28/21  Patient will be independent with initial HEP. Goal status: ongoing   LONG TERM GOALS: Target date: 02/01/22  Patient will be independent with advanced/ongoing HEP to improve outcomes and carryover.  Goal status: INITIAL  2.  Patient will report at least 75% improvement in R knee pain to improve QOL.  Baseline: 85%- 9/14 Goal status: MET  3.  Patient will demonstrate improved functional LE strength as demonstrated by >=4/5 with MMT. Goal status: INITIAL  4.  Patient will report 82 on FOTO to demonstrate improved functional ability. Baseline: 53 Goal status: INITIAL   5.  Patient will be able to walk outdoors on uneven terrain and on inclines without pain in R knee.  Baseline: discomfort with uphill, "not as much" 9/14 Goal status: IN PROGRESS    PLAN: PT FREQUENCY: 1-2x/week  PT DURATION: 8 weeks  PLANNED INTERVENTIONS: Therapeutic exercises, Therapeutic activity, Neuromuscular re-education, Balance training, Gait training, Patient/Family education, Self Care, Joint mobilization, Stair training, Cryotherapy, Moist heat, Vasopneumatic device, Traction, Ionotophoresis 66m/ml Dexamethasone, and Manual therapy  PLAN FOR NEXT SESSION: How did HEP go? Progress   AFranki Monte, PTA 12/22/2021, 12:28 PM [place] [address]Phone: [phone]  Fax:  [phone] Patient Details  Name: JKESSA FAIRBAIRNMRN: [id number] of Birth: 108/16/1952Referring Provider:  WMarda Stalker PA-C  Encounter Date: 12/22/2021   MAndris Baumann PT 12/22/2021, 12:28 PM  [place] [address]Phone: [phone]  Fax:  [phone]

## 2021-12-22 ENCOUNTER — Ambulatory Visit: Payer: Medicare Other

## 2021-12-22 DIAGNOSIS — M6281 Muscle weakness (generalized): Secondary | ICD-10-CM

## 2021-12-22 DIAGNOSIS — M25561 Pain in right knee: Secondary | ICD-10-CM | POA: Diagnosis not present

## 2021-12-22 DIAGNOSIS — G8929 Other chronic pain: Secondary | ICD-10-CM | POA: Diagnosis not present

## 2021-12-26 DIAGNOSIS — L989 Disorder of the skin and subcutaneous tissue, unspecified: Secondary | ICD-10-CM | POA: Diagnosis not present

## 2021-12-26 DIAGNOSIS — G3184 Mild cognitive impairment, so stated: Secondary | ICD-10-CM | POA: Diagnosis not present

## 2021-12-26 DIAGNOSIS — Z6824 Body mass index (BMI) 24.0-24.9, adult: Secondary | ICD-10-CM | POA: Diagnosis not present

## 2021-12-26 DIAGNOSIS — Z23 Encounter for immunization: Secondary | ICD-10-CM | POA: Diagnosis not present

## 2021-12-26 DIAGNOSIS — E785 Hyperlipidemia, unspecified: Secondary | ICD-10-CM | POA: Diagnosis not present

## 2021-12-27 ENCOUNTER — Ambulatory Visit: Payer: Medicare Other | Admitting: Physical Therapy

## 2021-12-27 ENCOUNTER — Encounter: Payer: Self-pay | Admitting: Physical Therapy

## 2021-12-27 DIAGNOSIS — G8929 Other chronic pain: Secondary | ICD-10-CM | POA: Diagnosis not present

## 2021-12-27 DIAGNOSIS — M6281 Muscle weakness (generalized): Secondary | ICD-10-CM | POA: Diagnosis not present

## 2021-12-27 DIAGNOSIS — M25561 Pain in right knee: Secondary | ICD-10-CM | POA: Diagnosis not present

## 2021-12-27 NOTE — Therapy (Signed)
OUTPATIENT PHYSICAL THERAPY LOWER EXTREMITY  Patient Name: Caitlyn Fields MRN: [id number] DOB:Dec 16, 1950, 71 y.o., female Today's Date: 12/27/2021   PT End of Session - 12/27/21 1146     Visit Number 7    Date for PT Re-Evaluation 02/01/22    PT Start Time 1145    PT Stop Time 1230    PT Time Calculation (min) 45 min    Activity Tolerance Patient tolerated treatment well    Behavior During Therapy [facility] for tasks assessed/performed              Past Medical History:  Diagnosis Date   Depression    Headache(784.0)    Past Surgical History:  Procedure Laterality Date   CESAREAN SECTION     Patient Active Problem List   Diagnosis Date Noted   Hereditary and idiopathic peripheral neuropathy 03/08/2015    PCP: Yaakov Guthrie  REFERRING PROVIDER: [name]  REFERRING DIAG: [phone]   THERAPY DIAG:  Muscle weakness (generalized)  Chronic pain of right knee  Rationale for Evaluation and Treatment Rehabilitation  ONSET DATE: 10/31/21  SUBJECTIVE:   SUBJECTIVE STATEMENT:  Doing pretty good Are you having pain? No  PRECAUTIONS: None  WEIGHT BEARING RESTRICTIONS No  FALLS:  Has patient fallen in last 6 months? No  LIVING ENVIRONMENT: Lives with: lives alone Lives in: House/apartment Stairs: No Has following equipment at home: None  OCCUPATION: retired  PLOF: Independent  PATIENT GOALS to learn which knee sleeves/braces to get, strengthen the muscles in my leg    OBJECTIVE:   DIAGNOSTIC FINDINGS: none for the knee  PATIENT SURVEYS:  FOTO 53/100  COGNITION:  Overall cognitive status: Within functional limits for tasks assessed     SENSATION: WFL  MUSCLE LENGTH: Hamstrings: moderate bilateral tightness, more tight on the R side    LOWER EXTREMITY ROM: WFL    LOWER EXTREMITY MMT:  MMT Right eval Left eval  Hip flexion 3+ 3+  Hip extension 4- 4-  Hip abduction 3+ 3+  Hip adduction    Hip internal rotation 4 4  Hip external  rotation 4 4  Knee flexion 4 4  Knee extension 4- 4  Ankle dorsiflexion 5 5  Ankle plantarflexion 5 5  Ankle inversion    Ankle eversion     (Blank rows = not tested)  FUNCTIONAL TESTS:  5 times sit to stand: 13.35s Timed up and go (TUG): 11.14s   TODAY'S TREATMENT: 12/27/21 Bike L2 x3mns  LAQ 3lb 2x10 each  Seated march 3lb 2x10  HS curls Red 2x10 Add ball squeeze 2x10 Hip Abd green 2x10  6 in forward step ups x10 4in lateral step ups   12/22/21 Walking outdoors  Bike L2 x663ms  LAQ 2.5# 2x10 HS curls 10# 2x10  SLR 2# x10  Clamshells greenTB 2x10 Bridges 2x10  12/15/21 HS curls 10lb 2x10 Unable to complete machine leg Ext, LAQ 1lb 2x10 each, stopped at 7 on second set with RLE 10lb side step (5 each side) x3 each, LOB x1  Step ups 4 in x 10 each Lateral 4 in x 10 each    12/13/21 Walking outdoors, big lap  Nustep L5 x5 Step ups 6"  Lateral step ups 6"  2 way hip 2# 2x10  Leg ext 5# 2x5 HS curls 10# 2x10   12/06/21 Nustep L 4 6 min LE only STS on airex 2 sets 10- cued to control eccentric LAQ 3# 2 sets 10- cued for full TKE ADD ball squeeze 2  sets 10 Hip abd green tband 2 sets 10 Hip flex green tband 2 sets 10 Leg Press feet 3 way 12 x each 20# HS curl seated green tband 2 sets 10 Resisted gait 4 way 4 x 20 #   Eval and POC   PATIENT EDUCATION:  Education details: HEP Person educated: Patient Education method: Explanation Education comprehension: verbalized understanding   HOME EXERCISE PROGRAM:  [id number]  ASSESSMENT:  CLINICAL IMPRESSION: Patient states she is doing better, walking up to an half mile a day. Avoided machine level interventions, and elected to use ankle weight and thera-band. No reports of increase pain during session. Some muscular fatigue reported with interventions. Cue with step ups needed for sequencing.  REHAB POTENTIAL: Good  CLINICAL DECISION MAKING: Stable/uncomplicated  EVALUATION COMPLEXITY:  Low   GOALS: Goals reviewed with patient? Yes  SHORT TERM GOALS: Target date: 12/28/21  Patient will be independent with initial HEP. Goal status: ongoing   LONG TERM GOALS: Target date: 02/01/22  Patient will be independent with advanced/ongoing HEP to improve outcomes and carryover.  Goal status: INITIAL  2.  Patient will report at least 75% improvement in R knee pain to improve QOL.  Baseline: 85%- 9/14 Goal status: MET  3.  Patient will demonstrate improved functional LE strength as demonstrated by >=4/5 with MMT. Goal status: INITIAL  4.  Patient will report 71 on FOTO to demonstrate improved functional ability. Baseline: 53 Goal status: INITIAL   5.  Patient will be able to walk outdoors on uneven terrain and on inclines without pain in R knee.  Baseline: discomfort with uphill, "not as much" 9/14 Goal status: IN PROGRESS    PLAN: PT FREQUENCY: 1-2x/week  PT DURATION: 8 weeks  PLANNED INTERVENTIONS: Therapeutic exercises, Therapeutic activity, Neuromuscular re-education, Balance training, Gait training, Patient/Family education, Self Care, Joint mobilization, Stair training, Cryotherapy, Moist heat, Vasopneumatic device, Traction, Ionotophoresis 46m/ml Dexamethasone, and Manual therapy  PLAN FOR NEXT SESSION: How did HEP go? Progress    Patient Details  Name: JWAHNETA DEROCHERMRN: [id number] of Birth: [birth year]Referring Provider:  WMarda Stalker PA-C  Encounter Date: 12/27/2021   RScot Jun PTA 12/27/2021, 11:47 AM  [place] [address]Phone: [phone]  Fax:  [phone]

## 2021-12-28 ENCOUNTER — Ambulatory Visit (INDEPENDENT_AMBULATORY_CARE_PROVIDER_SITE_OTHER): Payer: Medicare Other | Admitting: Physician Assistant

## 2021-12-28 ENCOUNTER — Encounter: Payer: Self-pay | Admitting: Physician Assistant

## 2021-12-28 ENCOUNTER — Other Ambulatory Visit (INDEPENDENT_AMBULATORY_CARE_PROVIDER_SITE_OTHER): Payer: Medicare Other

## 2021-12-28 VITALS — BP 133/83 | HR 68 | Resp 18 | Ht 63.0 in | Wt 134.0 lb

## 2021-12-28 DIAGNOSIS — R413 Other amnesia: Secondary | ICD-10-CM | POA: Diagnosis not present

## 2021-12-28 DIAGNOSIS — G3184 Mild cognitive impairment, so stated: Secondary | ICD-10-CM

## 2021-12-28 NOTE — Patient Instructions (Addendum)
It was a pleasure to see you today at our office.   Recommendations:   MRI of the brain, the radiology office will call you to arrange you appointment Check labs today Follow up  Oct 12 at 11:30   Whom to call:  Memory  decline, memory medications: Call our office [phone]   For psychiatric meds, mood meds: Please have your primary care physician manage these medications.   Counseling regarding caregiver distress, including caregiver depression, anxiety and issues regarding community resources, adult day care programs, adult living facilities, or memory care questions:   Feel free to contact Misty Lisabeth Registeraylor Palladino, Social Worker at [phone]   For assessment of decision of mental capacity and competency:  Call Dr. Erick BlinksMichelle Haber, geriatric psychiatrist at [phone]  For guidance in geriatric dementia issues please call [facility] [phone]  For guidance regarding [facility] and if placement were needed at the facility, contact Sidney AceNicole Reynolds, Social Worker tel: [phone]  If you have any severe symptoms of a stroke, or other severe issues such as confusion,severe chills or fever, etc call 911 or go to the ER as you may need to be evaluated further      RECOMMENDATIONS FOR ALL PATIENTS WITH MEMORY PROBLEMS: 1. Continue to exercise (Recommend 30 minutes of walking everyday, or 3 hours every week) 2. Increase social interactions - continue going to [place] and enjoy social gatherings with friends and family 3. Eat healthy, avoid fried foods and eat more fruits and vegetables 4. Maintain adequate blood pressure, blood sugar, and blood cholesterol level. Reducing the risk of stroke and cardiovascular disease also helps promoting better memory. 5. Avoid stressful situations. Live a simple life and avoid aggravations. Organize your time and prepare for the next day in anticipation. 6. Sleep well, avoid any interruptions of sleep and avoid any  distractions in the bedroom that may interfere with adequate sleep quality 7. Avoid sugar, avoid sweets as there is a strong link between excessive sugar intake, diabetes, and cognitive impairment We discussed the Mediterranean diet, which has been shown to help patients reduce the risk of progressive memory disorders and reduces cardiovascular risk. This includes eating fish, eat fruits and green leafy vegetables, nuts like almonds and hazelnuts, walnuts, and also use olive oil. Avoid fast foods and fried foods as much as possible. Avoid sweets and sugar as sugar use has been linked to worsening of memory function.  There is always a concern of gradual progression of memory problems. If this is the case, then we may need to adjust level of care according to patient needs. Support, both to the patient and caregiver, should then be put into place.       FALL PRECAUTIONS: Be cautious when walking. Scan the area for obstacles that may increase the risk of trips and falls. When getting up in the mornings, sit up at the edge of the bed for a few minutes before getting out of bed. Consider elevating the bed at the head end to avoid drop of blood pressure when getting up. Walk always in a well-lit room (use night lights in the walls). Avoid area rugs or power cords from appliances in the middle of the walkways. Use a walker or a cane if necessary and consider physical therapy for balance exercise. Get your eyesight checked regularly.  FINANCIAL OVERSIGHT: Supervision, especially oversight when making financial decisions or transactions is also recommended.  HOME SAFETY: Consider the safety of the kitchen when operating appliances like stoves,  microwave oven, and blender. Consider having supervision and share cooking responsibilities until no longer able to participate in those. Accidents with firearms and other hazards in the house should be identified and addressed as well.   ABILITY TO BE LEFT ALONE: If  patient is unable to contact 911 operator, consider using LifeLine, or when the need is there, arrange for someone to stay with patients. Smoking is a fire hazard, consider supervision or cessation. Risk of wandering should be assessed by caregiver and if detected at any point, supervision and safe proof recommendations should be instituted.  MEDICATION SUPERVISION: Inability to self-administer medication needs to be constantly addressed. Implement a mechanism to ensure safe administration of the medications.   DRIVING: Regarding driving, in patients with progressive memory problems, driving will be impaired. We advise to have someone else do the driving if trouble finding directions or if minor accidents are reported. Independent driving assessment is available to determine safety of driving.   If you are interested in the driving assessment, you can contact the following:  [employer] in [place] [phone]  [facility] [phone]  [facility] [phone] or [phone]    Mediterranean Diet A Mediterranean diet refers to food and lifestyle choices that are based on the traditions of countries located on the [employer]. This way of eating has been shown to help prevent certain conditions and improve outcomes for people who have chronic diseases, like kidney disease and heart disease. What are tips for following this plan? Lifestyle  Cook and eat meals together with your family, when possible. Drink enough fluid to keep your urine clear or pale yellow. Be physically active every day. This includes: Aerobic exercise like running or swimming. Leisure activities like gardening, walking, or housework. Get 7-8 hours of sleep each night. If recommended by your health care provider, drink red wine in moderation. This means 1 glass a day for nonpregnant women and 2 glasses a day for men. A glass of wine  equals 5 oz (150 mL). Reading food labels  Check the serving size of packaged foods. For foods such as rice and pasta, the serving size refers to the amount of cooked product, not dry. Check the total fat in packaged foods. Avoid foods that have saturated fat or trans fats. Check the ingredients list for added sugars, such as corn syrup. Shopping  At the grocery store, buy most of your food from the areas near the walls of the store. This includes: Fresh fruits and vegetables (produce). Grains, beans, nuts, and seeds. Some of these may be available in unpackaged forms or large amounts (in bulk). Fresh seafood. Poultry and eggs. Low-fat dairy products. Buy whole ingredients instead of prepackaged foods. Buy fresh fruits and vegetables in-season from local farmers markets. Buy frozen fruits and vegetables in resealable bags. If you do not have access to quality fresh seafood, buy precooked frozen shrimp or canned fish, such as tuna, salmon, or sardines. Buy small amounts of raw or cooked vegetables, salads, or olives from the deli or salad bar at your store. Stock your pantry so you always have certain foods on hand, such as olive oil, canned tuna, canned tomatoes, rice, pasta, and beans. Cooking  Cook foods with extra-virgin olive oil instead of using butter or other vegetable oils. Have meat as a side dish, and have vegetables or grains as your main dish. This means having meat in small portions or adding small amounts of meat to foods  like pasta or stew. Use beans or vegetables instead of meat in common dishes like chili or lasagna. Experiment with different cooking methods. Try roasting or broiling vegetables instead of steaming or sauteing them. Add frozen vegetables to soups, stews, pasta, or rice. Add nuts or seeds for added healthy fat at each meal. You can add these to yogurt, salads, or vegetable dishes. Marinate fish or vegetables using olive oil, lemon juice, garlic, and fresh  herbs. Meal planning  Plan to eat 1 vegetarian meal one day each week. Try to work up to 2 vegetarian meals, if possible. Eat seafood 2 or more times a week. Have healthy snacks readily available, such as: Vegetable sticks with hummus. Greek yogurt. Fruit and nut trail mix. Eat balanced meals throughout the week. This includes: Fruit: 2-3 servings a day Vegetables: 4-5 servings a day Low-fat dairy: 2 servings a day Fish, poultry, or lean meat: 1 serving a day Beans and legumes: 2 or more servings a week Nuts and seeds: 1-2 servings a day Whole grains: 6-8 servings a day Extra-virgin olive oil: 3-4 servings a day Limit red meat and sweets to only a few servings a month What are my food choices? Mediterranean diet Recommended Grains: Whole-grain pasta. Brown rice. Bulgar wheat. Polenta. Couscous. Whole-wheat bread. [place]. Vegetables: Artichokes. Beets. Broccoli. Cabbage. Carrots. Eggplant. Green beans. Chard. Kale. Spinach. Onions. Leeks. Peas. Squash. Tomatoes. Peppers. Radishes. Fruits: Apples. Apricots. Avocado. Berries. Bananas. Cherries. Dates. Figs. Grapes. Lemons. Melon. Oranges. Peaches. Plums. Pomegranate. Meats and other protein foods: Beans. Almonds. Sunflower seeds. Pine nuts. Peanuts. [place]. Salmon. Scall[place]. Shrimp. [place]. Tilapia. Clams. Oysters. Eggs. Dairy: Low-fat milk. Cheese. Greek yogurt. Beverages: Water. Red wine. Herbal tea. Fats and oils: Extra virgin olive oil. Avocado oil. Grape seed oil. Sweets and desserts: [place] yogurt with honey. Baked apples. Poached pears. Trail mix. Seasoning and other foods: Basil. Cilantro. Coriander. Cumin. Mint. Parsley. Sage. Rosemary. Tarragon. Garlic. Oregano. Thyme. Pepper. Balsalmic vinegar. Tahini. Hummus. Tomato sauce. Olives. Mushrooms. Limit these Grains: Prepackaged pasta or rice dishes. Prepackaged cereal with added sugar. Vegetables: Deep fried potatoes (french fries). Fruits: Fruit canned in syrup. Meats and  other protein foods: Beef. Pork. Lamb. Poultry with skin. Hot dogs. [place] Salines. Dairy: Ice cream. Sour cream. Whole milk. Beverages: Juice. Sugar-sweetened soft drinks. Beer. Liquor and spirits. Fats and oils: Butter. Canola oil. Vegetable oil. Beef fat (tallow). Lard. Sweets and desserts: Cookies. Cakes. Pies. Candy. Seasoning and other foods: Mayonnaise. Premade sauces and marinades. The items listed may not be a complete list. Talk with your dietitian about what dietary choices are right for you. Summary The Mediterranean diet includes both food and lifestyle choices. Eat a variety of fresh fruits and vegetables, beans, nuts, seeds, and whole grains. Limit the amount of red meat and sweets that you eat. Talk with your health care provider about whether it is safe for you to drink red wine in moderation. This means 1 glass a day for nonpregnant women and 2 glasses a day for men. A glass of wine equals 5 oz (150 mL). This information is not intended to replace advice given to you by your health care provider. Make sure you discuss any questions you have with your health care provider. Document Released: 11/18/2015 Document Revised: 12/21/2015 Document Reviewed: 11/18/2015 Elsevier Interactive Patient Education  2017 [employer].   We have sent a referral to [facility] for your MRI and they will call you directly to schedule your appointment. They are located at [place].  If you need to contact them directly please call [phone].   Your provider has requested that you have labwork completed today. Please go to [facility] ([id number]) on the second floor of this building before leaving the office today. You do not need to check in. If you are not called within 15 minutes please check with the front desk.

## 2021-12-28 NOTE — Progress Notes (Signed)
Assessment/Plan:    The patient is seen in neurologic consultation at the request of [name], PA-C for the evaluation of memory.  Caitlyn Fields is a very pleasant 71 y.o. year old RH female with  a history of hypertension, hyperlipidemia, Vit D deficiency, hearing loss, depression, OCD, anxiety,  seen today for evaluation of progressing memory loss. MoCA today is 22 /30 .There is possible behavioral component  and sleep abnormalities to these findings workup is in progress.    Mild Cognitive Impairment of uncertain etiology   MRI brain without contrast to assess for underlying structural abnormality and assess vascular load  Continue mood control as per [facility] and PCP ( anxiety/depression ) Check B12, TSH, D  Patient to address insomnia with her PCP Fol[facility]ow up in 1 month    Subjective:    The patient is here alone.    How long did patient have memory difficulties? For 2 years she has noted having more difficulties finding  difficulties with word finding and basic names . Her boyfriend prepares a calendar for appointments."Sometimes I leave a letter out of a word and bothers me very much ". "All my life struggled with basic memory , and sometimes she can get confused about names. She had a neurocognitive test 30 years ago at [facility] and was found to be "average". Her LTM is normal. Patient lives with:    Patient lives alone repeats oneself? Denies  Disoriented when walking into a room?  Patient denies   Leaving objects in unusual places? She has been forgetting to place food back in the fridge or freezer, container lids are mixed up, mixes up trash with recycling. She reports "this bothers me a lot" Ambulates  with difficulty?   Patient denies   Recent falls?  Patient denies   Any head injuries?  Endorsed. Several years ago History of seizures?   Patient denies   Wandering behavior?  Patient denies   Patient drives?  Denies  Any mood changes?  Patient denies   Any  history of depression?: Endorsed, some bouts throughout the year with increased sleep during those events.   Hallucinations?  Occasionally I see " Spiders coming down  but only when I am in the bed " Paranoia?  Patient denies   Patient reports that sleeps well without vivid dreams, REM behavior or sleepwalking "as long as not too much caffeine or long naps "   History of sleep apnea?  Patient denies   Any hygiene concerns?  She reports being a Hoarder and decreased interest in showering, she states she needs to make a point of showering more often  Independent of bathing and dressing?  Endorsed  Does the patient needs help with medications?  She is in charge of meds but there was one episode in which she took 2 meds together "and was spaced out"  Who is in charge of the finances?  Patient is in charge   Any changes in appetite?  Patient denies, unless "If I am stressed I eat more"    Patient have trouble swallowing? Patient denies   Does the patient cook?  Patient denies   Any kitchen accidents such as leaving the stove on? Patient denies   Any headaches? Occasionally, years ago she had some migraines, not recently   Double vision? Patient denies   Any focal numbness or tingling?  Patient denies   Chronic back pain Patient denies   Unilateral weakness?  Patient denies   Any tremors?  Patient denies   Any history of anosmia?  Patient denies   Any incontinence of urine?  Patient denies   Any bowel dysfunction?   Patient denies   History of heavy alcohol intake?  Patient denies   History of heavy tobacco use?  Patient denies   Family history of dementia? Brother may have some dementia      Allergies  Allergen Reactions   Sulfa Antibiotics Itching    Current Outpatient Medications  Medication Instructions   buPROPion (WELLBUTRIN) 75 mg, Oral, 2 times daily   doxycycline (VIBRAMYCIN) 100 mg, Oral, 2 times daily   FLUoxetine (PROZAC) 20 mg, Oral, Daily after breakfast,     loratadine  (CLARITIN) 10 mg, Oral, Daily PRN   Misc Natural Products (NEURIVA PO) Oral   omeprazole (PRILOSEC) 20 mg, Oral, Daily, Daily for 2 weeks, then as needed.     VITALS:   Vitals:   12/28/21 1323  BP: 133/83  Pulse: 68  Resp: 18  SpO2: 98%  Weight: 134 lb (60.8 kg)  Height: 5\' 3"  (1.6 m)      09/25/2014    2:13 PM  Depression screen PHQ 2/9  Decreased Interest 0  Down, Depressed, Hopeless 0  PHQ - 2 Score 0    PHYSICAL EXAM   HEENT:  Normocephalic, atraumatic. The mucous membranes are moist. The superficial temporal arteries are without ropiness or tenderness. Cardiovascular: Regular rate and rhythm. Lungs: Clear to auscultation bilaterally. Neck: There are no carotid bruits noted bilaterally.  NEUROLOGICAL:    12/28/2021    1:00 PM  Montreal Cognitive Assessment   Visuospatial/ Executive (0/5) 4  Naming (0/3) 3  Attention: Read list of digits (0/2) 2  Attention: Read list of letters (0/1) 1  Attention: Serial 7 subtraction starting at 100 (0/3) 0  Language: Repeat phrase (0/2) 1  Language : Fluency (0/1) 1  Abstraction (0/2) 1  Delayed Recall (0/5) 3  Orientation (0/6) 6  Total 22  Adjusted Score (based on education) 22        No data to display           Orientation:  Alert and oriented to person, place and time. No aphasia or dysarthria. Fund of knowledge is appropriate. Recent memory impaired and remote memory intact.  Attention and concentration are normal.  Able to name objects and repeat phrases. Delayed recall 3/5 . Language fluency as excellent. There was some testing anxiety present.   Cranial nerves: There is good facial symmetry. Extraocular muscles are intact and visual fields are full to confrontational testing. Speech is fluent and clear. Soft palate rises symmetrically and there is no tongue deviation. Hearing is intact to conversational tone. Tone: Tone is good throughout. Sensation: Sensation is intact to light touch and pinprick throughout.  Vibration is intact at the bilateral big toe.There is no extinction with double simultaneous stimulation. There is no sensory dermatomal level identified. Coordination: The patient has no difficulty with RAM's or FNF bilaterally. Normal finger to nose  Motor: Strength is 5/5 in the bilateral upper and lower extremities. There is no pronator drift. There are no fasciculations noted. DTR's: Deep tendon reflexes are 2/4 at the bilateral biceps, triceps, brachioradialis, patella and achilles.  Plantar responses are downgoing bilaterally. Gait and Station: The patient is able to ambulate without difficulty.The patient is able to heel toe walk without any difficulty.The patient is able to ambulate in a tandem fashion. The patient is able to stand in the Romberg position.  Thank you for allowing [place] the opportunity to participate in the care of this nice patient. Please do not hesitate to contact us for any questions or concerns.   Total time spent on today's visit was 60 minutes dedicated to this patient today, preparing to see patient, examining the patient, ordering tests and/or medications and counseling the patient, documenting clinical information in the EHR or other health record, independently interpreting results and communicating results to the patient/family, discussing treatment and goals, answering patient's questions and coordinating care.  Cc:  Jarrett Soho, PA-C  Marlowe Kays 12/28/2021 5:04 PM

## 2021-12-29 NOTE — Therapy (Signed)
OUTPATIENT PHYSICAL THERAPY LOWER EXTREMITY  Patient Name: Caitlyn Fields MRN: [id number] DOB:06/25/1950, 71 y.o., female Today's Date: 12/30/2021   PT End of Session - 12/30/21 1016     Visit Number 8    Date for PT Re-Evaluation 02/01/22    PT Start Time 1015    PT Stop Time 1058    PT Time Calculation (min) 43 min    Activity Tolerance Patient tolerated treatment well    Behavior During Therapy [facility] for tasks assessed/performed               Past Medical History:  Diagnosis Date   Depression    Headache(784.0)    Past Surgical History:  Procedure Laterality Date   CESAREAN SECTION     Patient Active Problem List   Diagnosis Date Noted   Mild cognitive impairment of uncertain or unknown etiology 12/28/2021   Hereditary and idiopathic peripheral neuropathy 03/08/2015    PCP: Yaakov Guthrie  REFERRING PROVIDER: [name]  REFERRING DIAG: [phone]   THERAPY DIAG:  Muscle weakness (generalized)  Chronic pain of right knee  Rationale for Evaluation and Treatment Rehabilitation  ONSET DATE: 10/31/21  SUBJECTIVE:   SUBJECTIVE STATEMENT:  Doing pretty good, not having pain but I haven't moved around.  Are you having pain? No  PRECAUTIONS: None  WEIGHT BEARING RESTRICTIONS No  FALLS:  Has patient fallen in last 6 months? No  LIVING ENVIRONMENT: Lives with: lives alone Lives in: House/apartment Stairs: No Has following equipment at home: None  OCCUPATION: retired  PLOF: Independent  PATIENT GOALS to learn which knee sleeves/braces to get, strengthen the muscles in my leg    OBJECTIVE:   DIAGNOSTIC FINDINGS: none for the knee  PATIENT SURVEYS:  FOTO 53/100  COGNITION:  Overall cognitive status: Within functional limits for tasks assessed     SENSATION: WFL  MUSCLE LENGTH: Hamstrings: moderate bilateral tightness, more tight on the R side    LOWER EXTREMITY ROM: WFL    LOWER EXTREMITY MMT:  MMT Right eval Left eval   Hip flexion 3+ 3+  Hip extension 4- 4-  Hip abduction 3+ 3+  Hip adduction    Hip internal rotation 4 4  Hip external rotation 4 4  Knee flexion 4 4  Knee extension 4- 4  Ankle dorsiflexion 5 5  Ankle plantarflexion 5 5  Ankle inversion    Ankle eversion     (Blank rows = not tested)  FUNCTIONAL TESTS:  5 times sit to stand: 13.35s Timed up and go (TUG): 11.14s   TODAY'S TREATMENT: 12/30/21 Nustep L5 x39mns  LAQ 3# 2x10 Standing marches 3# 2x10 Hip abd/ext 3# 2x10  Walking on beam forwards and side steps  Calf raises 2x12 on 6"  Gastroc stretch  Lateral band walks greenTB   12/27/21 Bike L2 x660ms  LAQ 3lb 2x10 each  Seated march 3lb 2x10  HS curls Red 2x10 Add ball squeeze 2x10 Hip Abd green 2x10  6 in forward step ups x10 4in lateral step ups   12/22/21 Walking outdoors  Bike L2 x6m13m  LAQ 2.5# 2x10 HS curls 10# 2x10  SLR 2# x10  Clamshells greenTB 2x10 Bridges 2x10  12/15/21 HS curls 10lb 2x10 Unable to complete machine leg Ext, LAQ 1lb 2x10 each, stopped at 7 on second set with RLE 10lb side step (5 each side) x3 each, LOB x1  Step ups 4 in x 10 each Lateral 4 in x 10 each    12/13/21 Walking outdoors, big  lap  Nustep L5 x5 Step ups 6"  Lateral step ups 6"  2 way hip 2# 2x10  Leg ext 5# 2x5 HS curls 10# 2x10   12/06/21 Nustep L 4 6 min LE only STS on airex 2 sets 10- cued to control eccentric LAQ 3# 2 sets 10- cued for full TKE ADD ball squeeze 2 sets 10 Hip abd green tband 2 sets 10 Hip flex green tband 2 sets 10 Leg Press feet 3 way 12 x each 20# HS curl seated green tband 2 sets 10 Resisted gait 4 way 4 x 20 #   Eval and POC   PATIENT EDUCATION:  Education details: HEP Person educated: Patient Education method: Explanation Education comprehension: verbalized understanding   HOME EXERCISE PROGRAM:  [id number]  ASSESSMENT:  CLINICAL IMPRESSION: Patient states she is doing better. Avoided machine level interventions as patient  states she feels fatigued after doing too much here. Used ankle weights and thera-band. No reports of increase pain during session. Tightness in calves and hamstrings noted.  REHAB POTENTIAL: Good  CLINICAL DECISION MAKING: Stable/uncomplicated  EVALUATION COMPLEXITY: Low   GOALS: Goals reviewed with patient? Yes  SHORT TERM GOALS: Target date: 12/28/21  Patient will be independent with initial HEP. Goal status: met   LONG TERM GOALS: Target date: 02/01/22  Patient will be independent with advanced/ongoing HEP to improve outcomes and carryover.  Goal status: INITIAL  2.  Patient will report at least 75% improvement in R knee pain to improve QOL.  Baseline: 85%- 9/14 Goal status: MET  3.  Patient will demonstrate improved functional LE strength as demonstrated by >=4/5 with MMT. Goal status: INITIAL  4.  Patient will report 31 on FOTO to demonstrate improved functional ability. Baseline: 53 Goal status: INITIAL   5.  Patient will be able to walk outdoors on uneven terrain and on inclines without pain in R knee.  Baseline: discomfort with uphill, "not as much" 9/14 Goal status: IN PROGRESS    PLAN: PT FREQUENCY: 1-2x/week  PT DURATION: 8 weeks  PLANNED INTERVENTIONS: Therapeutic exercises, Therapeutic activity, Neuromuscular re-education, Balance training, Gait training, Patient/Family education, Self Care, Joint mobilization, Stair training, Cryotherapy, Moist heat, Vasopneumatic device, Traction, Ionotophoresis 29m/ml Dexamethasone, and Manual therapy  PLAN FOR NEXT SESSION:     Patient Details  Name: JJACOBI RYANTMRN: [id number] of Birth: 118-Nov-1952Referring Provider:  WMarda Stalker PA-C  Encounter Date: 12/30/2021   MAndris Baumann PT 12/30/2021, 10:58 AM  [place] [address]Phone: [phone]  Fax:  [phone]

## 2021-12-30 ENCOUNTER — Ambulatory Visit: Payer: Medicare Other

## 2021-12-30 DIAGNOSIS — M6281 Muscle weakness (generalized): Secondary | ICD-10-CM

## 2021-12-30 DIAGNOSIS — G8929 Other chronic pain: Secondary | ICD-10-CM | POA: Diagnosis not present

## 2021-12-30 DIAGNOSIS — M25561 Pain in right knee: Secondary | ICD-10-CM | POA: Diagnosis not present

## 2021-12-30 LAB — VITAMIN D 25 HYDROXY (VIT D DEFICIENCY, FRACTURES): VITD: 63.03 ng/mL (ref 30.00–100.00)

## 2021-12-30 LAB — VITAMIN B12: Vitamin B-12: 432 pg/mL (ref 211–911)

## 2021-12-30 LAB — TSH: TSH: 1.23 u[IU]/mL (ref 0.35–5.50)

## 2021-12-30 NOTE — Progress Notes (Signed)
Blood tests are normal, thanks

## 2022-01-03 ENCOUNTER — Encounter: Payer: Self-pay | Admitting: Physical Therapy

## 2022-01-03 ENCOUNTER — Ambulatory Visit: Payer: Medicare Other | Admitting: Physical Therapy

## 2022-01-03 DIAGNOSIS — M6281 Muscle weakness (generalized): Secondary | ICD-10-CM

## 2022-01-03 DIAGNOSIS — G8929 Other chronic pain: Secondary | ICD-10-CM | POA: Diagnosis not present

## 2022-01-03 DIAGNOSIS — M25561 Pain in right knee: Secondary | ICD-10-CM | POA: Diagnosis not present

## 2022-01-03 NOTE — Therapy (Signed)
OUTPATIENT PHYSICAL THERAPY LOWER EXTREMITY  Patient Name: Caitlyn Fields MRN: [id number] DOB:10/24/1950, 71 y.o., female Today's Date: 01/03/2022   PT End of Session - 01/03/22 1149     Visit Number 9    Date for PT Re-Evaluation 02/01/22    PT Start Time 1146    PT Stop Time 1230    PT Time Calculation (min) 44 min    Activity Tolerance Patient tolerated treatment well    Behavior During Therapy [facility] for tasks assessed/performed               Past Medical History:  Diagnosis Date   Depression    Headache(784.0)    Past Surgical History:  Procedure Laterality Date   CESAREAN SECTION     Patient Active Problem List   Diagnosis Date Noted   Mild cognitive impairment of uncertain or unknown etiology 12/28/2021   Hereditary and idiopathic peripheral neuropathy 03/08/2015    PCP: Yaakov Guthrie  REFERRING PROVIDER: [name]  REFERRING DIAG: [phone]   THERAPY DIAG:  Muscle weakness (generalized)  Chronic pain of right knee  Rationale for Evaluation and Treatment Rehabilitation  ONSET DATE: 10/31/21  SUBJECTIVE:   SUBJECTIVE STATEMENT:  Doing pretty good, pain for a split second in the R knee yesterday  Are you having pain? No  PRECAUTIONS: None  WEIGHT BEARING RESTRICTIONS No  FALLS:  Has patient fallen in last 6 months? No  LIVING ENVIRONMENT: Lives with: lives alone Lives in: House/apartment Stairs: No Has following equipment at home: None  OCCUPATION: retired  PLOF: Independent  PATIENT GOALS to learn which knee sleeves/braces to get, strengthen the muscles in my leg    OBJECTIVE:   DIAGNOSTIC FINDINGS: none for the knee  PATIENT SURVEYS:  FOTO 53/100  COGNITION:  Overall cognitive status: Within functional limits for tasks assessed     SENSATION: WFL  MUSCLE LENGTH: Hamstrings: moderate bilateral tightness, more tight on the R side    LOWER EXTREMITY ROM: WFL    LOWER EXTREMITY MMT:  MMT Right eval Left eval   Hip flexion 3+ 3+  Hip extension 4- 4-  Hip abduction 3+ 3+  Hip adduction    Hip internal rotation 4 4  Hip external rotation 4 4  Knee flexion 4 4  Knee extension 4- 4  Ankle dorsiflexion 5 5  Ankle plantarflexion 5 5  Ankle inversion    Ankle eversion     (Blank rows = not tested)  FUNCTIONAL TESTS:  5 times sit to stand: 13.35s Timed up and go (TUG): 11.14s   TODAY'S TREATMENT:  01/03/22 Bike L2 x 6 min  Gait outside around back building widest part of parking lot LAQ 3lb 2x10  Hip abd 3lb 2x10 Hip Ext 3lb 2x10 HS curls red 2x15 Heel raises black bar 2x10 6in step ups form airex x10 each  12/30/21 Nustep L5 x33mns  LAQ 3# 2x10 Standing marches 3# 2x10 Hip abd/ext 3# 2x10  Walking on beam forwards and side steps  Calf raises 2x12 on 6"  Gastroc stretch  Lateral band walks greenTB   12/27/21 Bike L2 x644ms  LAQ 3lb 2x10 each  Seated march 3lb 2x10  HS curls Red 2x10 Add ball squeeze 2x10 Hip Abd green 2x10  6 in forward step ups x10 4in lateral step ups   12/22/21 Walking outdoors  Bike L2 x6m15m  LAQ 2.5# 2x10 HS curls 10# 2x10  SLR 2# x10  Clamshells greenTB 2x10 Bridges 2x10  12/15/21 HS curls 10lb 2x10  Unable to complete machine leg Ext, LAQ 1lb 2x10 each, stopped at 7 on second set with RLE 10lb side step (5 each side) x3 each, LOB x1  Step ups 4 in x 10 each Lateral 4 in x 10 each    12/13/21 Walking outdoors, big lap  Nustep L5 x5 Step ups 6"  Lateral step ups 6"  2 way hip 2# 2x10  Leg ext 5# 2x5 HS curls 10# 2x10   12/06/21 Nustep L 4 6 min LE only STS on airex 2 sets 10- cued to control eccentric LAQ 3# 2 sets 10- cued for full TKE ADD ball squeeze 2 sets 10 Hip abd green tband 2 sets 10 Hip flex green tband 2 sets 10 Leg Press feet 3 way 12 x each 20# HS curl seated green tband 2 sets 10 Resisted gait 4 way 4 x 20 #   Eval and POC   PATIENT EDUCATION:  Education details: HEP Person educated: Patient Education method:  Explanation Education comprehension: verbalized understanding   HOME EXERCISE PROGRAM:  [id number]  ASSESSMENT:  CLINICAL IMPRESSION: Patient enters doing well. sed ankle weights and thera-band. No reports of increase pain during session. Tightness in calves and hamstrings noted. No issues with outdoor ambulation up and down slope. Some instability with step ups from airex.  REHAB POTENTIAL: Good  CLINICAL DECISION MAKING: Stable/uncomplicated  EVALUATION COMPLEXITY: Low   GOALS: Goals reviewed with patient? Yes  SHORT TERM GOALS: Target date: 12/28/21  Patient will be independent with initial HEP. Goal status: met   LONG TERM GOALS: Target date: 02/01/22  Patient will be independent with advanced/ongoing HEP to improve outcomes and carryover.  Goal status: INITIAL  2.  Patient will report at least 75% improvement in R knee pain to improve QOL.  Baseline: 85%- 9/14 Goal status: MET  3.  Patient will demonstrate improved functional LE strength as demonstrated by >=4/5 with MMT. Goal status: INITIAL  4.  Patient will report 68 on FOTO to demonstrate improved functional ability. Baseline: 53 Goal status: INITIAL   5.  Patient will be able to walk outdoors on uneven terrain and on inclines without pain in R knee.  Baseline: discomfort with uphill, "not as much" 9/14 Goal status: IN PROGRESS    PLAN: PT FREQUENCY: 1-2x/week  PT DURATION: 8 weeks  PLANNED INTERVENTIONS: Therapeutic exercises, Therapeutic activity, Neuromuscular re-education, Balance training, Gait training, Patient/Family education, Self Care, Joint mobilization, Stair training, Cryotherapy, Moist heat, Vasopneumatic device, Traction, Ionotophoresis 74m/ml Dexamethasone, and Manual therapy  PLAN FOR NEXT SESSION:      RScot Jun PTA 01/03/2022, 11:50 AM

## 2022-01-05 ENCOUNTER — Ambulatory Visit: Payer: Medicare Other

## 2022-01-05 DIAGNOSIS — G8929 Other chronic pain: Secondary | ICD-10-CM | POA: Diagnosis not present

## 2022-01-05 DIAGNOSIS — M6281 Muscle weakness (generalized): Secondary | ICD-10-CM | POA: Diagnosis not present

## 2022-01-05 DIAGNOSIS — M25561 Pain in right knee: Secondary | ICD-10-CM | POA: Diagnosis not present

## 2022-01-05 NOTE — Therapy (Signed)
OUTPATIENT PHYSICAL THERAPY LOWER EXTREMITY  Patient Name: Caitlyn Fields MRN: [id number] DOB:1950/10/10, 71 y.o., female Today's Date: 01/05/2022  Progress Note Reporting Period 11/30/21 to 01/05/22  See note below for Objective Data and Assessment of Progress/Goals.      PT End of Session - 01/05/22 1153     Visit Number 10    Date for PT Re-Evaluation 02/01/22    PT Start Time 1152    PT Stop Time 1230    PT Time Calculation (min) 38 min    Activity Tolerance Patient tolerated treatment well    Behavior During Therapy [facility] for tasks assessed/performed                Past Medical History:  Diagnosis Date   Depression    Headache(784.0)    Past Surgical History:  Procedure Laterality Date   CESAREAN SECTION     Patient Active Problem List   Diagnosis Date Noted   Mild cognitive impairment of uncertain or unknown etiology 12/28/2021   Hereditary and idiopathic peripheral neuropathy 03/08/2015    PCP: Yaakov Guthrie  REFERRING PROVIDER: [name]  REFERRING DIAG: [phone]   THERAPY DIAG:  Muscle weakness (generalized)  Chronic pain of right knee  Rationale for Evaluation and Treatment Rehabilitation  ONSET DATE: 10/31/21  SUBJECTIVE:   SUBJECTIVE STATEMENT:    Doing pretty good was straining my knees in the past couple of days from doing too much.   Are you having pain? No  PRECAUTIONS: None  WEIGHT BEARING RESTRICTIONS No  FALLS:  Has patient fallen in last 6 months? No  LIVING ENVIRONMENT: Lives with: lives alone Lives in: House/apartment Stairs: No Has following equipment at home: None  OCCUPATION: retired  PLOF: Independent  PATIENT GOALS to learn which knee sleeves/braces to get, strengthen the muscles in my leg    OBJECTIVE:   DIAGNOSTIC FINDINGS: none for the knee  PATIENT SURVEYS:  FOTO 53/100  COGNITION:  Overall cognitive status: Within functional limits for tasks assessed     SENSATION: WFL  MUSCLE  LENGTH: Hamstrings: moderate bilateral tightness, more tight on the R side    LOWER EXTREMITY ROM: WFL    LOWER EXTREMITY MMT:  MMT Right eval Left eval Right 01/05/22 Left 01/05/22  Hip flexion 3+ 3+ 4+ 4+  Hip extension 4- 4- 4+ 4+  Hip abduction 3+ 3+ 4+ 4+  Hip adduction      Hip internal rotation 4 4 4+ 4+  Hip external rotation 4 4 4+ 4+  Knee flexion 4 4 4+ 4+  Knee extension 4- 4    Ankle dorsiflexion 5 5    Ankle plantarflexion 5 5    Ankle inversion      Ankle eversion       (Blank rows = not tested)  FUNCTIONAL TESTS:  5 times sit to stand: 13.35s Timed up and go (TUG): 11.14s   TODAY'S TREATMENT: 01/05/22 Bike L3 x42mns  Progress note- FOTO, MMT, reassesed goals Gastroc stretch 30s Leg ext 5# 2x10 HS curls 10# 2x10   01/03/22 Bike L2 x 6 min  Gait outside around back building widest part of parking lot LAQ 3lb 2x10  Hip abd 3lb 2x10 Hip Ext 3lb 2x10 HS curls red 2x15 Heel raises black bar 2x10 6in step ups form airex x10 each  12/30/21 Nustep L5 x690ms  LAQ 3# 2x10 Standing marches 3# 2x10 Hip abd/ext 3# 2x10  Walking on beam forwards and side steps  Calf raises 2x12 on 6"  Gastroc stretch  Lateral band walks greenTB   12/27/21 Bike L2 x40mns  LAQ 3lb 2x10 each  Seated march 3lb 2x10  HS curls Red 2x10 Add ball squeeze 2x10 Hip Abd green 2x10  6 in forward step ups x10 4in lateral step ups   12/22/21 Walking outdoors  Bike L2 x643ms  LAQ 2.5# 2x10 HS curls 10# 2x10  SLR 2# x10  Clamshells greenTB 2x10 Bridges 2x10  12/15/21 HS curls 10lb 2x10 Unable to complete machine leg Ext, LAQ 1lb 2x10 each, stopped at 7 on second set with RLE 10lb side step (5 each side) x3 each, LOB x1  Step ups 4 in x 10 each Lateral 4 in x 10 each    12/13/21 Walking outdoors, big lap  Nustep L5 x5 Step ups 6"  Lateral step ups 6"  2 way hip 2# 2x10  Leg ext 5# 2x5 HS curls 10# 2x10   12/06/21 Nustep L 4 6 min LE only STS on airex 2 sets 10-  cued to control eccentric LAQ 3# 2 sets 10- cued for full TKE ADD ball squeeze 2 sets 10 Hip abd green tband 2 sets 10 Hip flex green tband 2 sets 10 Leg Press feet 3 way 12 x each 20# HS curl seated green tband 2 sets 10 Resisted gait 4 way 4 x 20 #   Eval and POC   PATIENT EDUCATION:  Education details: HEP Person educated: Patient Education method: Explanation Education comprehension: verbalized understanding   HOME EXERCISE PROGRAM:  Access Code: [email or web address] URL: [email or web address] Date: 01/05/2022 Prepared by: MoAndris BaumannExercises - Supine Bridge  - 1 x daily - 7 x weekly - 2 sets - 10 reps - Hooklying Clamshell with Resistance  - 1 x daily - 7 x weekly - 2 sets - 10 reps - Supine Hamstring Stretch  - 1 x daily - 7 x weekly - 3 reps - 15 hold - Supine Figure 4 Piriformis Stretch  - 1 x daily - 7 x weekly - 3 reps - Seated Hamstring Stretch  - 1 x daily - 7 x weekly - 3 reps - Side Stepping with Resistance at Ankles  - 1 x daily - 7 x weekly - 2 sets - 10 reps - Sit to Stand Without Arm Support  - 1 x daily - 7 x weekly - 2 sets - 10 reps - Standing Abdominal Curl with Anchored Resistance  - 1 x daily - 7 x weekly - 2 sets - 10 reps - Bird Dog  - 1 x daily - 7 x weekly - 2 sets - 10 reps - Supine Hip Adduction Isometric with Ball  - 1 x daily - 7 x weekly - 2 sets - 10 reps  ASSESSMENT:  CLINICAL IMPRESSION: Progress note, patient has met all her goals. We revised her HEP and went over some exercises that she can do at the gym. D/C.  REHAB POTENTIAL: Good  CLINICAL DECISION MAKING: Stable/uncomplicated  EVALUATION COMPLEXITY: Low   GOALS: Goals reviewed with patient? Yes  SHORT TERM GOALS: Target date: 12/28/21  Patient will be independent with initial HEP. Goal status: MET   LONG TERM GOALS: Target date: 02/01/22  Patient will be independent with advanced/ongoing HEP to improve outcomes and carryover.  Goal status: MET  2.  Patient  will report at least 75% improvement in R knee pain to improve QOL.  Baseline: 85%- 9/14 Goal status: MET  3.  Patient will demonstrate improved functional LE strength  as demonstrated by >=4/5 with MMT. Goal status: MET  4.  Patient will report 74 on FOTO to demonstrate improved functional ability. Baseline: 82- 9/28 Goal status: MET   5.  Patient will be able to walk outdoors on uneven terrain and on inclines without pain in R knee.  Baseline: discomfort with uphill, "not as much" 9/14, 9/28- able to do this without pain as long as I don't do too much Goal status: MET    PLAN: PT FREQUENCY: 1-2x/week  PT DURATION: 8 weeks  PLANNED INTERVENTIONS: Therapeutic exercises, Therapeutic activity, Neuromuscular re-education, Balance training, Gait training, Patient/Family education, Self Care, Joint mobilization, Stair training, Cryotherapy, Moist heat, Vasopneumatic device, Traction, Ionotophoresis 23m/ml Dexamethasone, and Manual therapy  PLAN FOR NEXT SESSION:  PHYSICAL THERAPY DISCHARGE SUMMARY  Visits from Start of Care: 10  Patient agrees to discharge. Patient goals were met. Patient is being discharged due to meeting the stated rehab goals.     [place] PT 01/05/2022, 12:30 PM

## 2022-01-06 DIAGNOSIS — Z9189 Other specified personal risk factors, not elsewhere classified: Secondary | ICD-10-CM | POA: Diagnosis not present

## 2022-01-10 ENCOUNTER — Ambulatory Visit: Payer: Medicare Other | Admitting: Physical Therapy

## 2022-01-12 ENCOUNTER — Ambulatory Visit: Payer: Medicare Other

## 2022-01-13 ENCOUNTER — Other Ambulatory Visit: Payer: Medicare Other

## 2022-01-18 ENCOUNTER — Ambulatory Visit
Admission: RE | Admit: 2022-01-18 | Discharge: 2022-01-18 | Disposition: A | Discharge status: Home / Self Care | Payer: Medicare Other | Source: Ambulatory Visit | Attending: Physician Assistant | Admitting: Physician Assistant

## 2022-01-18 DIAGNOSIS — J341 Cyst and mucocele of nose and nasal sinus: Secondary | ICD-10-CM | POA: Diagnosis not present

## 2022-01-18 DIAGNOSIS — R413 Other amnesia: Secondary | ICD-10-CM | POA: Diagnosis not present

## 2022-01-18 DIAGNOSIS — R531 Weakness: Secondary | ICD-10-CM | POA: Diagnosis not present

## 2022-01-19 ENCOUNTER — Ambulatory Visit: Payer: Medicare Other | Admitting: Physician Assistant

## 2022-01-19 NOTE — Progress Notes (Signed)
MRI brain completely normal, thanks

## 2022-01-27 ENCOUNTER — Other Ambulatory Visit: Payer: Medicare Other

## 2022-01-31 ENCOUNTER — Encounter: Payer: Self-pay | Admitting: Physician Assistant

## 2022-01-31 ENCOUNTER — Ambulatory Visit (INDEPENDENT_AMBULATORY_CARE_PROVIDER_SITE_OTHER): Payer: Medicare Other | Admitting: Physician Assistant

## 2022-01-31 VITALS — BP 130/83 | HR 89 | Resp 18 | Ht 63.0 in | Wt 139.0 lb

## 2022-01-31 DIAGNOSIS — R413 Other amnesia: Secondary | ICD-10-CM | POA: Diagnosis not present

## 2022-01-31 NOTE — Patient Instructions (Signed)
It was a pleasure to see you today at our office.   Recommendations: No abnormalities in the brain, recommend follow up with your primary doctor and with Koleen Nimrod luck!!!     RECOMMENDATIONS FOR ALL PATIENTS WITH MEMORY PROBLEMS: 1. Continue to exercise (Recommend 30 minutes of walking everyday, or 3 hours every week) 2. Increase social interactions - continue going to [place] and enjoy social gatherings with friends and family 3. Eat healthy, avoid fried foods and eat more fruits and vegetables 4. Maintain adequate blood pressure, blood sugar, and blood cholesterol level. Reducing the risk of stroke and cardiovascular disease also helps promoting better memory. 5. Avoid stressful situations. Live a simple life and avoid aggravations. Organize your time and prepare for the next day in anticipation. 6. Sleep well, avoid any interruptions of sleep and avoid any distractions in the bedroom that may interfere with adequate sleep quality 7. Avoid sugar, avoid sweets as there is a strong link between excessive sugar intake, diabetes, and cognitive impairment We discussed the Mediterranean diet, which has been shown to help patients reduce the risk of progressive memory disorders and reduces cardiovascular risk. This includes eating fish, eat fruits and green leafy vegetables, nuts like almonds and hazelnuts, walnuts, and also use olive oil. Avoid fast foods and fried foods as much as possible. Avoid sweets and sugar as sugar use has been linked to worsening of memory function.  There is always a concern of gradual progression of memory problems. If this is the case, then we may need to adjust level of care according to patient needs. Support, both to the patient and caregiver, should then be put into place.       FALL PRECAUTIONS: Be cautious when walking. Scan the area for obstacles that may increase the risk of trips and falls. When getting up in the mornings, sit up at the edge of the bed for  a few minutes before getting out of bed. Consider elevating the bed at the head end to avoid drop of blood pressure when getting up. Walk always in a well-lit room (use night lights in the walls). Avoid area rugs or power cords from appliances in the middle of the walkways. Use a walker or a cane if necessary and consider physical therapy for balance exercise. Get your eyesight checked regularly.  FINANCIAL OVERSIGHT: Supervision, especially oversight when making financial decisions or transactions is also recommended.  HOME SAFETY: Consider the safety of the kitchen when operating appliances like stoves, microwave oven, and blender. Consider having supervision and share cooking responsibilities until no longer able to participate in those. Accidents with firearms and other hazards in the house should be identified and addressed as well.   ABILITY TO BE LEFT ALONE: If patient is unable to contact 911 operator, consider using LifeLine, or when the need is there, arrange for someone to stay with patients. Smoking is a fire hazard, consider supervision or cessation. Risk of wandering should be assessed by caregiver and if detected at any point, supervision and safe proof recommendations should be instituted.  MEDICATION SUPERVISION: Inability to self-administer medication needs to be constantly addressed. Implement a mechanism to ensure safe administration of the medications.   DRIVING: Regarding driving, in patients with progressive memory problems, driving will be impaired. We advise to have someone else do the driving if trouble finding directions or if minor accidents are reported. Independent driving assessment is available to determine safety of driving.   If you are interested in the driving assessment,  you can contact the following:  [employer] in [place] [phone]  [facility] [phone]  [facility] [phone]  [phone] or [phone]    Mediterranean Diet A Mediterranean diet refers to food and lifestyle choices that are based on the traditions of countries located on the [employer]. This way of eating has been shown to help prevent certain conditions and improve outcomes for people who have chronic diseases, like kidney disease and heart disease. What are tips for following this plan? Lifestyle  Cook and eat meals together with your family, when possible. Drink enough fluid to keep your urine clear or pale yellow. Be physically active every day. This includes: Aerobic exercise like running or swimming. Leisure activities like gardening, walking, or housework. Get 7-8 hours of sleep each night. If recommended by your health care provider, drink red wine in moderation. This means 1 glass a day for nonpregnant women and 2 glasses a day for men. A glass of wine equals 5 oz (150 mL). Reading food labels  Check the serving size of packaged foods. For foods such as rice and pasta, the serving size refers to the amount of cooked product, not dry. Check the total fat in packaged foods. Avoid foods that have saturated fat or trans fats. Check the ingredients list for added sugars, such as corn syrup. Shopping  At the grocery store, buy most of your food from the areas near the walls of the store. This includes: Fresh fruits and vegetables (produce). Grains, beans, nuts, and seeds. Some of these may be available in unpackaged forms or large amounts (in bulk). Fresh seafood. Poultry and eggs. Low-fat dairy products. Buy whole ingredients instead of prepackaged foods. Buy fresh fruits and vegetables in-season from local farmers markets. Buy frozen fruits and vegetables in resealable bags. If you do not have access to quality fresh seafood, buy precooked frozen shrimp or canned fish, such as tuna, salmon, or sardines. Buy small amounts of raw or cooked vegetables, salads, or olives from the  deli or salad bar at your store. Stock your pantry so you always have certain foods on hand, such as olive oil, canned tuna, canned tomatoes, rice, pasta, and beans. Cooking  Cook foods with extra-virgin olive oil instead of using butter or other vegetable oils. Have meat as a side dish, and have vegetables or grains as your main dish. This means having meat in small portions or adding small amounts of meat to foods like pasta or stew. Use beans or vegetables instead of meat in common dishes like chili or lasagna. Experiment with different cooking methods. Try roasting or broiling vegetables instead of steaming or sauteing them. Add frozen vegetables to soups, stews, pasta, or rice. Add nuts or seeds for added healthy fat at each meal. You can add these to yogurt, salads, or vegetable dishes. Marinate fish or vegetables using olive oil, lemon juice, garlic, and fresh herbs. Meal planning  Plan to eat 1 vegetarian meal one day each week. Try to work up to 2 vegetarian meals, if possible. Eat seafood 2 or more times a week. Have healthy snacks readily available, such as: Vegetable sticks with hummus. Greek yogurt. Fruit and nut trail mix. Eat balanced meals throughout the week. This includes: Fruit: 2-3 servings a day Vegetables: 4-5 servings a day Low-fat dairy: 2 servings a day Fish, poultry, or lean meat: 1 serving a day Beans and legumes: 2 or more servings a week Nuts and seeds: 1-2 servings  a day Whole grains: 6-8 servings a day Extra-virgin olive oil: 3-4 servings a day Limit red meat and sweets to only a few servings a month What are my food choices? Mediterranean diet Recommended Grains: Whole-grain pasta. Brown rice. Bulgar wheat. Polenta. Couscous. Whole-wheat bread. Orpah Cobb. Vegetables: Artichokes. Beets. Broccoli. Cabbage. Carrots. Eggplant. Green beans. Chard. Kale. Spinach. Onions. Leeks. Peas. Squash. Tomatoes. Peppers. Radishes. Fruits: Apples. Apricots.  Avocado. Berries. Bananas. Cherries. Dates. Figs. Grapes. Lemons. Melon. Oranges. Peaches. Plums. Pomegranate. Meats and other protein foods: Beans. Almonds. Sunflower seeds. Pine nuts. Peanuts. Cod. Salmon. Scallops. Shrimp. Tuna. Tilapia. Clams. Oysters. Eggs. Dairy: Low-fat milk. Cheese. Greek yogurt. Beverages: Water. Red wine. Herbal tea. Fats and oils: Extra virgin olive oil. Avocado oil. Grape seed oil. Sweets and desserts: [place] yogurt with honey. Baked apples. Poached pears. Trail mix. Seasoning and other foods: Basil. Cilantro. Coriander. Cumin. Mint. Parsley. Sage. Rosemary. Tarragon. Garlic. Oregano. Thyme. Pepper. Balsalmic vinegar. Tahini. Hummus. Tomato sauce. Olives. Mushrooms. Limit these Grains: Prepackaged pasta or rice dishes. Prepackaged cereal with added sugar. Vegetables: Deep fried potatoes (french fries). Fruits: Fruit canned in syrup. Meats and other protein foods: Beef. Pork. Lamb. Poultry with skin. Hot dogs. Tomasa Blase. Dairy: Ice cream. Sour cream. Whole milk. Beverages: Juice. Sugar-sweetened soft drinks. Beer. Liquor and spirits. Fats and oils: Butter. Canola oil. Vegetable oil. Beef fat (tallow). Lard. Sweets and desserts: Cookies. Cakes. Pies. Candy. Seasoning and other foods: Mayonnaise. Premade sauces and marinades. The items listed may not be a complete list. Talk with your dietitian about what dietary choices are right for you. Summary The Mediterranean diet includes both food and lifestyle choices. Eat a variety of fresh fruits and vegetables, beans, nuts, seeds, and whole grains. Limit the amount of red meat and sweets that you eat. Talk with your health care provider about whether it is safe for you to drink red wine in moderation. This means 1 glass a day for nonpregnant women and 2 glasses a day for men. A glass of wine equals 5 oz (150 mL). This information is not intended to replace advice given to you by your health care provider. Make sure you discuss  any questions you have with your health care provider. Document Released: 11/18/2015 Document Revised: 12/21/2015 Document Reviewed: 11/18/2015 Elsevier Interactive Patient Education  2017 [employer].   We have sent a referral to [facility] for your MRI and they will call you directly to schedule your appointment. They are located at [address] [facility]. If you need to contact them directly please call [phone].   Your provider has requested that you have labwork completed today. Please go to [facility] ([id number]) on the second floor of this building before leaving the office today. You do not need to check in. If you are not called within 15 minutes please check with the front desk.

## 2022-01-31 NOTE — Progress Notes (Signed)
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 RH female with  a history of hypertension, hyperlipidemia, Vit D deficiency, hearing loss, depression, OCD, anxiety seen today in follow up to discuss the MRI of the brain results. These were personally reviewed, with completely normal appearance  Patient is currently on     Follow up as needed Continue to control mood  as per [facility] and PCP for anxiety and depression  Recommend good control of cardiovascular risk factors.   Patient to address insomnia during her next PCP visit       Subjective:    This patient is accompanied in the office by  who supplements the history.  Previous records as well as any outside records available were reviewed prior to todays visit. Last seen on 12/28/21, at which time her MOCA was 22/30    Any changes in memory since last visit? Tolerating meds?   MRI brain was remarkable for    Initial Visit 12/28/21 How long did patient have memory difficulties? For 2 years she has noted having more difficulties finding  difficulties with word finding and basic names . Her boyfriend prepares a calendar for appointments."Sometimes I leave a letter out of a word and bothers me very much ". "All my life struggled with basic memory , and sometimes she can get confused about names. She [facility] and was found to be "average". Her LTM is normal. Patient lives with:    Patient lives alone repeats oneself? Denies  Disoriented when walking into a room?  Patient denies   Leaving objects in unusual places? She has been forgetting to place food back in the fridge or freezer, container lids are mixed up, mixes up trash with recycling. She reports "this bothers me a lot" Ambulates  with difficulty?   Patient denies   Recent falls?  Patient denies   Any head injuries?  Endorsed. Several years ago History of seizures?   Patient  denies   Wandering behavior?  Patient denies   Patient drives?  Denies  Any mood changes?  Patient denies   Any history of depression?: Endorsed, some bouts throughout the year with increased sleep during those events.   Hallucinations?  Occasionally I see " Spiders coming down  but only when I am in the bed " Paranoia?  Patient denies   Patient reports that sleeps well without vivid dreams, REM behavior or sleepwalking "as long as not too much caffeine or long naps "   History of sleep apnea?  Patient denies   Any hygiene concerns?  She reports being a Hoarder and decreased interest in showering, she states she needs to make a point of showering more often  Independent of bathing and dressing?  Endorsed  Does the patient needs help with medications?  She is in charge of meds but there was one episode in which she took 2 meds together "and was spaced out"  Who is in charge of the finances?  Patient is in charge   Any changes in appetite?  Patient denies, unless "If I am stressed I eat more"    Patient have trouble swallowing? Patient denies   Does the patient cook?  Patient denies   Any kitchen accidents such as leaving the stove on? Patient denies   Any headaches? Occasionally, years ago she had some migraines, not recently   Double vision? Patient denies  Any focal numbness or tingling?  Patient denies   Chronic back pain Patient denies   Unilateral weakness?  Patient denies   Any tremors?  Patient denies   Any history of anosmia?  Patient denies   Any incontinence of urine?  Patient denies   Any bowel dysfunction?   Patient denies   History of heavy alcohol intake?  Patient denies   History of heavy tobacco use?  Patient denies   Family history of dementia? Brother may have some dementia     CURRENT MEDICATIONS:  Outpatient Encounter Medications as of 01/31/2022  Medication Sig   buPROPion (WELLBUTRIN) 75 MG tablet Take 75 mg by mouth 2 (two) times daily.   doxycycline  (VIBRAMYCIN) 100 MG capsule Take 1 capsule (100 mg total) by mouth 2 (two) times daily. (Patient not taking: Reported on 12/28/2021)   FLUoxetine (PROZAC) 10 MG tablet Take 20 mg by mouth daily after breakfast.   loratadine (CLARITIN) 10 MG tablet Take 10 mg by mouth daily as needed for allergies.   Misc Natural Products (NEURIVA PO) Take by mouth.   omeprazole (PRILOSEC) 20 MG capsule Take 1 capsule (20 mg total) by mouth daily. Daily for 2 weeks, then as needed. (Patient taking differently: Take 20 mg by mouth daily as needed (reflux, indigestion).)   No facility-administered encounter medications on file as of 01/31/2022.        No data to display            12/30/2021   10:20 AM 12/28/2021    1:00 PM  Montreal Cognitive Assessment   Visuospatial/ Executive (0/5) 4 4  Naming (0/3) 3 3  Attention: Read list of digits (0/2) 2 2  Attention: Read list of letters (0/1) 1 1  Attention: Serial 7 subtraction starting at 100 (0/3) 1 0  Language: Repeat phrase (0/2) 1 1  Language : Fluency (0/1) 1 1  Abstraction (0/2) 1 1  Delayed Recall (0/5) 3 3  Orientation (0/6) 6 6  Total 23 22  Adjusted Score (based on education) 23 22   Thank you for allowing [place] the opportunity to participate in the care of this nice patient. Please do not hesitate to contact us for any questions or concerns.   Total time spent on today's visit was *** minutes dedicated to this patient today, preparing to see patient, examining the patient, ordering tests and/or medications and counseling the patient, documenting clinical information in the EHR or other health record, independently interpreting results and communicating results to the patient/family, discussing treatment and goals, answering patient's questions and coordinating care.  Cc:  [name], PA-C  Sharene Butters 01/31/2022 7:47 AM

## 2022-03-06 DIAGNOSIS — Z23 Encounter for immunization: Secondary | ICD-10-CM | POA: Diagnosis not present

## 2022-03-20 DIAGNOSIS — M8589 Other specified disorders of bone density and structure, multiple sites: Secondary | ICD-10-CM | POA: Diagnosis not present

## 2022-03-20 DIAGNOSIS — Z1231 Encounter for screening mammogram for malignant neoplasm of breast: Secondary | ICD-10-CM | POA: Diagnosis not present

## 2022-03-20 DIAGNOSIS — Z780 Asymptomatic menopausal state: Secondary | ICD-10-CM | POA: Diagnosis not present

## 2022-04-24 DIAGNOSIS — M25511 Pain in right shoulder: Secondary | ICD-10-CM | POA: Diagnosis not present

## 2022-04-24 DIAGNOSIS — M79642 Pain in left hand: Secondary | ICD-10-CM | POA: Diagnosis not present

## 2022-07-31 DIAGNOSIS — R109 Unspecified abdominal pain: Secondary | ICD-10-CM | POA: Diagnosis not present

## 2022-07-31 DIAGNOSIS — L293 Anogenital pruritus, unspecified: Secondary | ICD-10-CM | POA: Diagnosis not present

## 2022-07-31 DIAGNOSIS — L989 Disorder of the skin and subcutaneous tissue, unspecified: Secondary | ICD-10-CM | POA: Diagnosis not present

## 2022-10-06 DIAGNOSIS — M542 Cervicalgia: Secondary | ICD-10-CM | POA: Diagnosis not present

## 2022-10-26 DIAGNOSIS — L814 Other melanin hyperpigmentation: Secondary | ICD-10-CM | POA: Diagnosis not present

## 2022-10-30 DIAGNOSIS — L648 Other androgenic alopecia: Secondary | ICD-10-CM | POA: Diagnosis not present

## 2022-10-30 DIAGNOSIS — L659 Nonscarring hair loss, unspecified: Secondary | ICD-10-CM | POA: Diagnosis not present

## 2022-11-03 DIAGNOSIS — H60502 Unspecified acute noninfective otitis externa, left ear: Secondary | ICD-10-CM | POA: Diagnosis not present

## 2022-11-13 DIAGNOSIS — L648 Other androgenic alopecia: Secondary | ICD-10-CM | POA: Diagnosis not present

## 2022-11-27 DIAGNOSIS — L298 Other pruritus: Secondary | ICD-10-CM | POA: Diagnosis not present

## 2022-11-27 DIAGNOSIS — D225 Melanocytic nevi of trunk: Secondary | ICD-10-CM | POA: Diagnosis not present

## 2022-11-27 DIAGNOSIS — L538 Other specified erythematous conditions: Secondary | ICD-10-CM | POA: Diagnosis not present

## 2022-11-27 DIAGNOSIS — R208 Other disturbances of skin sensation: Secondary | ICD-10-CM | POA: Diagnosis not present

## 2022-11-27 DIAGNOSIS — L820 Inflamed seborrheic keratosis: Secondary | ICD-10-CM | POA: Diagnosis not present

## 2022-11-27 DIAGNOSIS — L814 Other melanin hyperpigmentation: Secondary | ICD-10-CM | POA: Diagnosis not present

## 2022-11-27 DIAGNOSIS — L821 Other seborrheic keratosis: Secondary | ICD-10-CM | POA: Diagnosis not present

## 2022-11-27 DIAGNOSIS — Z789 Other specified health status: Secondary | ICD-10-CM | POA: Diagnosis not present

## 2022-11-28 DIAGNOSIS — Z0000 Encounter for general adult medical examination without abnormal findings: Secondary | ICD-10-CM | POA: Diagnosis not present

## 2022-11-29 DIAGNOSIS — H40013 Open angle with borderline findings, low risk, bilateral: Secondary | ICD-10-CM | POA: Diagnosis not present

## 2022-11-29 DIAGNOSIS — H524 Presbyopia: Secondary | ICD-10-CM | POA: Diagnosis not present

## 2022-11-29 DIAGNOSIS — H25813 Combined forms of age-related cataract, bilateral: Secondary | ICD-10-CM | POA: Diagnosis not present

## 2022-11-29 DIAGNOSIS — Q141 Congenital malformation of retina: Secondary | ICD-10-CM | POA: Diagnosis not present

## 2022-11-29 DIAGNOSIS — H35363 Drusen (degenerative) of macula, bilateral: Secondary | ICD-10-CM | POA: Diagnosis not present

## 2022-12-01 DIAGNOSIS — M545 Low back pain, unspecified: Secondary | ICD-10-CM | POA: Diagnosis not present

## 2023-07-10 DIAGNOSIS — H40013 Open angle with borderline findings, low risk, bilateral: Secondary | ICD-10-CM | POA: Diagnosis not present

## 2023-07-10 DIAGNOSIS — H1045 Other chronic allergic conjunctivitis: Secondary | ICD-10-CM | POA: Diagnosis not present

## 2023-07-13 ENCOUNTER — Ambulatory Visit
Admission: EM | Admit: 2023-07-13 | Discharge: 2023-07-13 | Disposition: A | Discharge status: Home / Self Care | Attending: Internal Medicine | Admitting: Internal Medicine

## 2023-07-13 DIAGNOSIS — H66002 Acute suppurative otitis media without spontaneous rupture of ear drum, left ear: Secondary | ICD-10-CM | POA: Diagnosis not present

## 2023-07-13 MED ORDER — AMOXICILLIN 875 MG PO TABS: 875.0000 mg | ORAL_TABLET | Freq: Two times a day (BID) | ORAL | 0 refills | Status: AC

## 2023-07-13 NOTE — Discharge Instructions (Addendum)
 Symptoms and physical exam findings are consistent with mild otitis media of the left ear. There is a small fluid level visible behind the ear drum on the left which may be causing the pain. We will treat with the following:  Amoxicillin 875 mg twice daily for 10 days.  This is an antibiotic.  Take this with food. Tylenol 650 mg every 4-6 hours as needed for pain.  Return to urgent care or PCP if symptoms worsen or fail to resolve.

## 2023-07-13 NOTE — ED Provider Notes (Signed)
 [facility]    CSN: [id number] Arrival date & time: 07/13/23  1926      History   Chief Complaint Chief Complaint  Patient presents with   Ear Pain    HPI Caitlyn Fields is a 73 y.o. female.   73 year old female who presents urgent care with complaints of left ear pain.  This started about 4 days ago with a sudden pop in her ear.  It has been intermittently hurting since then.  She does have some seasonal allergies with mild congestion but has not had any fevers, chills, significant nasal congestion, cough, shortness of breath or other upper respiratory symptoms.  She denies any history of ear infections in the past.  She does use Q-tips to clean her ears at times.  She had not been using a Q-tip when she began having pain in the ear.     Past Medical History:  Diagnosis Date   Depression    Headache(784.0)     Patient Active Problem List   Diagnosis Date Noted   Mild cognitive impairment of uncertain or unknown etiology 12/28/2021   Hereditary and idiopathic peripheral neuropathy 03/08/2015    Past Surgical History:  Procedure Laterality Date   CESAREAN SECTION      OB History   No obstetric history on file.      Home Medications    Prior to Admission medications   Medication Sig Start Date End Date Taking? Authorizing Provider  amoxicillin (AMOXIL) 875 MG tablet Take 1 tablet (875 mg total) by mouth 2 (two) times daily for 10 days. 07/13/23 07/23/23 Yes Rumor Sun A, PA-C  buPROPion (WELLBUTRIN) 75 MG tablet Take 75 mg by mouth 2 (two) times daily. 05/04/21   [provider]  doxycycline (VIBRAMYCIN) 100 MG capsule Take 1 capsule (100 mg total) by mouth 2 (two) times daily. Patient not taking: Reported on 12/28/2021 01/25/21   Horton, Kristie M, DO  FLUoxetine (PROZAC) 10 MG tablet Take 20 mg by mouth daily after breakfast.    [provider]  loratadine (CLARITIN) 10 MG tablet Take 10 mg by mouth daily as needed for allergies.     [provider]  Misc Natural Products (NEURIVA PO) Take by mouth.    [provider]  omeprazole (PRILOSEC) 20 MG capsule Take 1 capsule (20 mg total) by mouth daily. Daily for 2 weeks, then as needed. Patient taking differently: Take 20 mg by mouth daily as needed (reflux, indigestion). 08/03/14   Charm Rings, MD    Family History Family History  Problem Relation Age of Onset   Heart murmur Father    Alcoholism Father    Heart attack Father        Deceased, 5   Depression Mother    Lung cancer Mother        Deceased, 46   Depression Brother    Healthy Brother    Depression Brother    Healthy Son        Twins    Social History Social History   Tobacco Use   Smoking status: Former    Current packs/day: 1.00    Average packs/day: 1 pack/day for 6.0 years (6.0 ttl pk-yrs)    Types: Cigarettes   Smokeless tobacco: Never  Substance Use Topics   Alcohol use: No    Alcohol/week: 0.0 standard drinks of alcohol   Drug use: No     Allergies   Sulfa antibiotics   Review of Systems Review of  Systems  Constitutional:  Negative for chills and fever.  HENT:  Positive for ear pain (left). Negative for sore throat.   Eyes:  Negative for pain and visual disturbance.  Respiratory:  Negative for cough and shortness of breath.   Cardiovascular:  Negative for chest pain and palpitations.  Gastrointestinal:  Negative for abdominal pain and vomiting.  Genitourinary:  Negative for dysuria and hematuria.  Musculoskeletal:  Negative for arthralgias and back pain.  Skin:  Negative for color change and rash.  Neurological:  Negative for seizures and syncope.  All other systems reviewed and are negative.    Physical Exam Triage Vital Signs ED Triage Vitals [07/13/23 1934]  Encounter Vitals Group     BP 110/71     Systolic BP Percentile      Diastolic BP Percentile      Pulse Rate 76     Resp 15     Temp 98 F (36.7 C)     Temp Source Oral     SpO2 97 %      Weight      Height      Head Circumference      Peak Flow      Pain Score 5     Pain Loc      Pain Education      Exclude from Growth Chart    No data found.  Updated Vital Signs BP 110/71 (BP Location: Right Arm)   Pulse 76   Temp 98 F (36.7 C) (Oral)   Resp 15   SpO2 97%   Visual Acuity Right Eye Distance:   Left Eye Distance:   Bilateral Distance:    Right Eye Near:   Left Eye Near:    Bilateral Near:     Physical Exam Vitals and nursing note reviewed.  Constitutional:      General: She is not in acute distress.    Appearance: She is well-developed.  HENT:     Head: Normocephalic and atraumatic.     Right Ear: Hearing normal.     Left Ear: A middle ear effusion is present. Tympanic membrane is not perforated, erythematous or bulging.     Ears:     Comments: Fluid level on the left that appears to be thicker Eyes:     Conjunctiva/sclera: Conjunctivae normal.  Cardiovascular:     Rate and Rhythm: Normal rate and regular rhythm.     Heart sounds: No murmur heard. Pulmonary:     Effort: Pulmonary effort is normal. No respiratory distress.     Breath sounds: Normal breath sounds.  Abdominal:     Palpations: Abdomen is soft.     Tenderness: There is no abdominal tenderness.  Musculoskeletal:        General: No swelling.     Cervical back: Neck supple.  Skin:    General: Skin is warm and dry.     Capillary Refill: Capillary refill takes less than 2 seconds.  Neurological:     Mental Status: She is alert.  Psychiatric:        Mood and Affect: Mood normal.      UC Treatments / Results  Labs (all labs ordered are listed, but only abnormal results are displayed) Labs Reviewed - No data to display  EKG   Radiology No results found.  Procedures Procedures (including critical care time)  Medications Ordered in UC Medications - No data to display  Initial Impression / Assessment and Plan / UC Course  I  have reviewed the triage vital signs  and the nursing notes.  Pertinent labs & imaging results that were available during my care of the patient were reviewed by me and considered in my medical decision making (see chart for details).     Non-recurrent acute suppurative otitis media of left ear without spontaneous rupture of tympanic membrane   Symptoms and physical exam findings are consistent with mild otitis media of the left ear. There is a small Nola Botkins appearing fluid level visible behind the tympanic membrane on the left which may be causing the pain. We will treat with the following:  Amoxicillin 875 mg twice daily for 10 days.  This is an antibiotic.  Take this with food. Tylenol 650 mg every 4-6 hours as needed for pain.  Return to urgent care or PCP if symptoms worsen or fail to resolve.    Final Clinical Impressions(s) / UC Diagnoses   Final diagnoses:  Non-recurrent acute suppurative otitis media of left ear without spontaneous rupture of tympanic membrane     Discharge Instructions      Symptoms and physical exam findings are consistent with mild otitis media of the left ear. There is a small fluid level visible behind the ear drum on the left which may be causing the pain. We will treat with the following:  Amoxicillin 875 mg twice daily for 10 days.  This is an antibiotic.  Take this with food. Tylenol 650 mg every 4-6 hours as needed for pain.  Return to urgent care or PCP if symptoms worsen or fail to resolve.     ED Prescriptions     Medication Sig Dispense Auth. Provider   amoxicillin (AMOXIL) 875 MG tablet Take 1 tablet (875 mg total) by mouth 2 (two) times daily for 10 days. 20 tablet Landis Martins, [place]      PDMP not reviewed this encounter.   Landis Martins, [place] 07/13/23 [phone]

## 2023-07-13 NOTE — ED Triage Notes (Signed)
 Pt present with c/o lt ear pain x 4 days. Pt states the pain is sharp. Uses Q-tips.   Has not taken anything for relief

## 2023-07-16 DIAGNOSIS — G3184 Mild cognitive impairment, so stated: Secondary | ICD-10-CM | POA: Diagnosis not present

## 2023-07-16 DIAGNOSIS — M8588 Other specified disorders of bone density and structure, other site: Secondary | ICD-10-CM | POA: Diagnosis not present

## 2023-07-16 DIAGNOSIS — R0602 Shortness of breath: Secondary | ICD-10-CM | POA: Diagnosis not present

## 2023-07-16 DIAGNOSIS — Z9181 History of falling: Secondary | ICD-10-CM | POA: Diagnosis not present

## 2023-09-16 ENCOUNTER — Ambulatory Visit
Admission: EM | Admit: 2023-09-16 | Discharge: 2023-09-16 | Disposition: A | Discharge status: Home / Self Care | Attending: Family Medicine | Admitting: Family Medicine

## 2023-09-16 DIAGNOSIS — Z760 Encounter for issue of repeat prescription: Secondary | ICD-10-CM | POA: Diagnosis not present

## 2023-09-16 DIAGNOSIS — B009 Herpesviral infection, unspecified: Secondary | ICD-10-CM | POA: Diagnosis not present

## 2023-09-16 MED ORDER — VALACYCLOVIR HCL 1 G PO TABS: ORAL_TABLET | ORAL | 0 refills | Status: DC

## 2023-09-16 NOTE — ED Provider Notes (Signed)
 [facility]  Note:  This document was prepared using Conservation officer, historic buildings and may include unintentional dictation errors.  MRN: [id number] DOB: 12-26-1950  Subjective:   Caitlyn Fields is a 73 y.o. 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  Patient typically responds well using valacyclovir.  She does have a history of cold sores as well.  Her current valacyclovir medication is expired and she took her last 2 pills yesterday.  No current facility-administered medications for this encounter.  Current Outpatient Medications:    buPROPion (WELLBUTRIN) 75 MG tablet, Take 75 mg by mouth 2 (two) times daily., Disp: , Rfl:    doxycycline  (VIBRAMYCIN ) 100 MG capsule, Take 1 capsule (100 mg total) by mouth 2 (two) times daily. (Patient not taking: Reported on 12/28/2021), Disp: 14 capsule, Rfl: 0   FLUoxetine (PROZAC) 10 MG tablet, Take 20 mg by mouth daily after breakfast., Disp: , Rfl:    loratadine (CLARITIN) 10 MG tablet, Take 10 mg by mouth daily as needed for allergies., Disp: , Rfl:    Misc Natural Products (NEURIVA PO), Take by mouth., Disp: , Rfl:    omeprazole  (PRILOSEC) 20 MG capsule, Take 1 capsule (20 mg total) by mouth daily. Daily for 2 weeks, then as needed. (Patient taking differently: Take 20 mg by mouth daily as needed (reflux, indigestion).), Disp: 30 capsule, Rfl: 0   Allergies  Allergen Reactions   Sulfa Antibiotics Itching    Past Medical History:  Diagnosis Date   Depression    Headache(784.0)      Past Surgical History:  Procedure Laterality Date   CESAREAN SECTION      Family History  Problem Relation Age of Onset   Heart murmur Father    Alcoholism Father    Heart attack Father        Deceased, 42   Depression Mother    Lung cancer Mother        Deceased, 46   Depression Brother    Healthy Brother    Depression Brother    Healthy Son        Twins    Social History   Tobacco Use    Smoking status: Former    Current packs/day: 1.00    Average packs/day: 1 pack/day for 6.0 years (6.0 ttl pk-yrs)    Types: Cigarettes   Smokeless tobacco: Never  Vaping Use   Vaping status: Never Used  Substance Use Topics   Alcohol use: No    Alcohol/week: 0.0 standard drinks of alcohol   Drug use: No    ROS   Objective:   Vitals: BP 134/79 (BP Location: Left Arm)   Pulse 76   Temp 98.4 F (36.9 C) (Oral)   Resp 16   SpO2 96%   Physical Exam Constitutional:      General: She is not in acute distress.    Appearance: Normal appearance. She is well-developed. She is not ill-appearing, toxic-appearing or diaphoretic.  HENT:     Head: Normocephalic and atraumatic.     Nose: Nose normal.     Mouth/Throat:     Mouth: Mucous membranes are moist.  Eyes:     General: No scleral icterus.       Right eye: No discharge.        Left eye: No discharge.     Extraocular Movements: Extraocular movements intact.  Cardiovascular:     Rate and Rhythm: Normal rate.  Pulmonary:  Effort: Pulmonary effort is normal.  Skin:    General: Skin is warm and dry.  Neurological:     General: No focal deficit present.     Mental Status: She is alert and oriented to person, place, and time.  Psychiatric:        Mood and Affect: Mood normal.        Behavior: Behavior normal.     Assessment and Plan :   PDMP not reviewed this encounter.  1. Herpes simplex   2. Encounter for medication refill    Refill for herpes simplex virus medication, valacyclovir, provided.   Adolph Hoop, [place] 09/16/23 1558

## 2023-09-16 NOTE — ED Triage Notes (Signed)
 Pt requesting med refill for herpes outbreak-NAD-steady gait

## 2023-10-04 DIAGNOSIS — R52 Pain, unspecified: Secondary | ICD-10-CM | POA: Diagnosis not present

## 2023-10-04 DIAGNOSIS — Z743 Need for continuous supervision: Secondary | ICD-10-CM | POA: Diagnosis not present

## 2023-10-04 DIAGNOSIS — R030 Elevated blood-pressure reading, without diagnosis of hypertension: Secondary | ICD-10-CM | POA: Diagnosis not present

## 2023-10-04 DIAGNOSIS — H1032 Unspecified acute conjunctivitis, left eye: Secondary | ICD-10-CM | POA: Diagnosis not present

## 2023-10-26 DIAGNOSIS — Z03818 Encounter for observation for suspected exposure to other biological agents ruled out: Secondary | ICD-10-CM | POA: Diagnosis not present

## 2023-10-26 DIAGNOSIS — R5383 Other fatigue: Secondary | ICD-10-CM | POA: Diagnosis not present

## 2023-10-26 DIAGNOSIS — R051 Acute cough: Secondary | ICD-10-CM | POA: Diagnosis not present

## 2023-10-26 DIAGNOSIS — R509 Fever, unspecified: Secondary | ICD-10-CM | POA: Diagnosis not present

## 2023-11-01 DIAGNOSIS — J040 Acute laryngitis: Secondary | ICD-10-CM | POA: Diagnosis not present

## 2023-11-05 ENCOUNTER — Ambulatory Visit: Attending: Cardiology | Admitting: Cardiology

## 2023-11-07 ENCOUNTER — Encounter: Payer: Self-pay | Admitting: Cardiology

## 2023-11-16 DIAGNOSIS — J029 Acute pharyngitis, unspecified: Secondary | ICD-10-CM | POA: Diagnosis not present

## 2023-11-16 DIAGNOSIS — J019 Acute sinusitis, unspecified: Secondary | ICD-10-CM | POA: Diagnosis not present

## 2023-12-26 DIAGNOSIS — Z23 Encounter for immunization: Secondary | ICD-10-CM | POA: Diagnosis not present

## 2023-12-26 DIAGNOSIS — Z0000 Encounter for general adult medical examination without abnormal findings: Secondary | ICD-10-CM | POA: Diagnosis not present

## 2023-12-27 DIAGNOSIS — B351 Tinea unguium: Secondary | ICD-10-CM | POA: Diagnosis not present

## 2023-12-27 DIAGNOSIS — L814 Other melanin hyperpigmentation: Secondary | ICD-10-CM | POA: Diagnosis not present

## 2023-12-27 DIAGNOSIS — L821 Other seborrheic keratosis: Secondary | ICD-10-CM | POA: Diagnosis not present

## 2023-12-31 ENCOUNTER — Encounter: Payer: Self-pay | Admitting: Student in an Organized Health Care Education/Training Program

## 2023-12-31 ENCOUNTER — Ambulatory Visit
Attending: Student in an Organized Health Care Education/Training Program | Admitting: Student in an Organized Health Care Education/Training Program

## 2023-12-31 VITALS — BP 128/74 | HR 75 | Ht 63.0 in | Wt 139.6 lb

## 2023-12-31 DIAGNOSIS — E7801 Familial hypercholesterolemia: Secondary | ICD-10-CM | POA: Diagnosis not present

## 2023-12-31 DIAGNOSIS — R0602 Shortness of breath: Secondary | ICD-10-CM

## 2023-12-31 DIAGNOSIS — E782 Mixed hyperlipidemia: Secondary | ICD-10-CM | POA: Diagnosis not present

## 2023-12-31 NOTE — Patient Instructions (Addendum)
 Medication Instructions:   Your physician recommends that you continue on your current medications as directed. Please refer to the Current Medication list given to you today.  *If you need a refill on your cardiac medications before your next appointment, please call your pharmacy*  Lab Work:  Your provider would like for you to have following labs drawn today TODAY.  -Hepatic/Liver function  -Lipoprotein A     If you have labs (blood work) drawn today and your tests are completely normal, you will receive your results only by: MyChart Message (if you have MyChart) OR A paper copy in the mail If you have any lab test that is abnormal or we need to change your treatment, we will call you to review the results.  Testing/Procedures:  NONE  Follow-Up: At [facility], you and your health needs are our priority.  As part of our continuing mission to provide you with exceptional heart care, our providers are all part of one team.  This team includes your primary Cardiologist (physician) and Advanced Practice Providers or APPs (Physician Assistants and Nurse Practitioners) who all work together to provide you with the care you need, when you need it.  Your next appointment:    6 month(s)  Provider:    Georganna Archer, MD  We recommend signing up for the patient portal called MyChart.  Sign up information is provided on this After Visit Summary.  MyChart is used to connect with patients for Virtual Visits (Telemedicine).  Patients are able to view lab/test results, encounter notes, upcoming appointments, etc.  Non-urgent messages can be sent to your provider as well.   To learn more about what you can do with MyChart, go to [email or web address].

## 2023-12-31 NOTE — Progress Notes (Signed)
 Cardiology Office Note:   Date:  12/31/2023  ID:  Caitlyn Fields, DOB 1950/04/21, MRN [id number] PCP: Katina Pfeiffer, PA-C  [facility] Providers Cardiologist:  Dr. Georganna Archer Chief Complaint:  Chief Complaint  Patient presents with   Shortness of Breath      History of Present Illness:   Caitlyn Fields is a 74 y.o. female with a PMH of mild cognitive impairment, anxiety and prior tobacco use (quit at age 57) who presents as a new patient referral by Pfeiffer Katina for SOB.  Patient presents today as referral for evaluation of SOB; however, the patient states that she is not having any issues with SOB.  She reports that back in May she had a bad URI/pneumonia and she developed difficulty breathing in the setting of this illness.  She has ongoing laryngitis per her report, but her breathing has normalized.  She denies chest pain, palpitations, SOB, DOE, swelling, PND, orthopnea, and syncope.  She has no cardiovascular concerns.    She lives alone in [place] with her 2 cats.  She has 2 adult twin boys live in [place] .  She is a former smoker but quit many years ago and drinks EtOH infrequently.  She denies illicit drug use.  Her family history is notable for her father who died in his 64s of an MI.  She is currently dealing with issues of mild cognitive impairment.  She had a previous brain MRI which was unremarkable.   Past Medical History:  Diagnosis Date   BMI 25.0-25.9,adult    Depression    Elevated cholesterol    Fibrocystic breast disease    Headache(784.0)    Hearing loss    HSV infection    Neuropathy of both feet    Neuropathy of lower extremity    Osteopenia    Osteopenia    SOB (shortness of breath)    Vitamin D  deficiency      Studies Reviewed:    EKG:  EKG Interpretation Date/Time:  Monday December 31 2023 14:52:20 EDT Ventricular Rate:  76 PR Interval:  142 QRS Duration:  72 QT Interval:  378 QTC Calculation: 425 R  Axis:   63  Text Interpretation: Normal sinus rhythm Low voltage QRS in precordial leads When compared with ECG of 06-Oct-2021 01:09, No significant change was found Confirmed by Archer Georganna [phone]) on 12/31/2023 2:58:33 PM         Risk Assessment/Calculations:              Physical Exam:     VS:  BP 128/74   Pulse 75   Ht 5' 3 (1.6 m)   Wt 139 lb 9.6 oz (63.3 kg)   SpO2 96%   BMI 24.73 kg/m      Wt Readings from Last 3 Encounters:  01/31/22 139 lb (63 kg)  12/28/21 134 lb (60.8 kg)  01/25/21 139 lb (63 kg)     GEN: Well nourished, well developed, in no acute distress NECK: No JVD; No carotid bruits CARDIAC: RRR, soft I/VI systolic murmur at LLSB, no rubs or gallops RESPIRATORY:  Clear to auscultation without rales, wheezing or rhonchi  ABDOMEN: Soft, non-tender, non-distended, normal bowel sounds EXTREMITIES:  Warm and well perfused, no edema; No deformity, 2+ radial pulses PSYCH: Normal mood and affect   Assessment & Plan SOB (shortness of breath) Her SOB resolved following resolution of her URI symptoms back in May.  Her exam is completely reassuring from a cardiovascular standpoint.  Her heart  murmur sounds like a benign flow murmur.  No additional evaluation is warranted at this time.  Familial hypercholesterolemia The patient shared with me that her father died of an MI in his 76s.  I reviewed the patient's last lipid panel that is available in our chart from 14 years ago and shows an LDL of 229.  This degree of LDL elevation is consistent with familial hypercholesterolemia.  I will check a lipid panel and lipoprotein a today, but I first see the need for cholesterol-lowering therapy in her near future. -Lipid panel, lipoprotein a - I will follow-up with the patient regarding the need for cholesterol-lowering after these results - Follow-up in 6 months      This note was written with the assistance of a dictation microphone or AI dictation software. Please  excuse any typos or grammatical errors.   Signed, Georganna Archer, MD 12/31/2023 1:44 PM    [facility]

## 2023-12-31 NOTE — Assessment & Plan Note (Signed)
 The patient shared with me that her father died of an MI in his 72s.  I reviewed the patient's last lipid panel that is available in our chart from 14 years ago and shows an LDL of 229.  This degree of LDL elevation is consistent with familial hypercholesterolemia.  I will check a lipid panel and lipoprotein a today, but I first see the need for cholesterol-lowering therapy in her near future. -Lipid panel, lipoprotein a - I will follow-up with the patient regarding the need for cholesterol-lowering after these results - Follow-up in 6 months

## 2024-01-01 ENCOUNTER — Other Ambulatory Visit: Payer: Self-pay

## 2024-01-01 DIAGNOSIS — E782 Mixed hyperlipidemia: Secondary | ICD-10-CM

## 2024-01-01 DIAGNOSIS — E7801 Familial hypercholesterolemia: Secondary | ICD-10-CM

## 2024-01-02 ENCOUNTER — Ambulatory Visit: Payer: Self-pay | Admitting: Student in an Organized Health Care Education/Training Program

## 2024-01-02 DIAGNOSIS — E782 Mixed hyperlipidemia: Secondary | ICD-10-CM

## 2024-01-02 LAB — HEPATIC FUNCTION PANEL
ALT: 17 IU/L (ref 0–32)
AST: 26 IU/L (ref 0–40)
Albumin: 4.4 g/dL (ref 3.8–4.8)
Alkaline Phosphatase: 66 IU/L (ref 49–135)
Bilirubin Total: 0.2 mg/dL (ref 0.0–1.2)
Bilirubin, Direct: 0.11 mg/dL (ref 0.00–0.40)
Total Protein: 6.3 g/dL (ref 6.0–8.5)

## 2024-01-02 LAB — LIPOPROTEIN A (LPA): Lipoprotein (a): 17 nmol/L (ref <75.0)

## 2024-01-07 LAB — SPECIMEN STATUS REPORT

## 2024-01-07 LAB — LIPID PANEL
Chol/HDL Ratio: 4.8 ratio — ABNORMAL HIGH (ref 0.0–4.4)
Cholesterol, Total: 229 mg/dL — ABNORMAL HIGH (ref 100–199)
HDL: 48 mg/dL (ref >39)
LDL Chol Calc (NIH): 127 mg/dL — ABNORMAL HIGH (ref 0–99)
Triglycerides: 303 mg/dL — ABNORMAL HIGH (ref 0–149)
VLDL Cholesterol Cal: 54 mg/dL — ABNORMAL HIGH (ref 5–40)

## 2024-01-15 NOTE — Telephone Encounter (Signed)
 Pt requesting a callback to discuss cholesterol levels, and treatment options. Please advise.

## 2024-01-24 ENCOUNTER — Encounter (HOSPITAL_COMMUNITY): Payer: Self-pay

## 2024-01-24 ENCOUNTER — Emergency Department (HOSPITAL_COMMUNITY)

## 2024-01-24 ENCOUNTER — Other Ambulatory Visit: Payer: Self-pay

## 2024-01-24 ENCOUNTER — Emergency Department (HOSPITAL_COMMUNITY): Admission: EM | Admit: 2024-01-24 | Discharge: 2024-01-24 | Disposition: A | Discharge status: Home / Self Care

## 2024-01-24 DIAGNOSIS — R55 Syncope and collapse: Secondary | ICD-10-CM | POA: Diagnosis not present

## 2024-01-24 DIAGNOSIS — R112 Nausea with vomiting, unspecified: Secondary | ICD-10-CM | POA: Insufficient documentation

## 2024-01-24 DIAGNOSIS — R1110 Vomiting, unspecified: Secondary | ICD-10-CM | POA: Diagnosis not present

## 2024-01-24 DIAGNOSIS — R42 Dizziness and giddiness: Secondary | ICD-10-CM | POA: Insufficient documentation

## 2024-01-24 LAB — CBC WITH DIFFERENTIAL/PLATELET
Abs Immature Granulocytes: 0.03 K/uL (ref 0.00–0.07)
Basophils Absolute: 0 K/uL (ref 0.0–0.1)
Basophils Relative: 0 %
Eosinophils Absolute: 0.1 K/uL (ref 0.0–0.5)
Eosinophils Relative: 1 %
HCT: 43.4 % (ref 36.0–46.0)
Hemoglobin: 13.4 g/dL (ref 12.0–15.0)
Immature Granulocytes: 0 %
Lymphocytes Relative: 10 %
Lymphs Abs: 0.8 K/uL (ref 0.7–4.0)
MCH: 29.5 pg (ref 26.0–34.0)
MCHC: 30.9 g/dL (ref 30.0–36.0)
MCV: 95.6 fL (ref 80.0–100.0)
Monocytes Absolute: 0.6 K/uL (ref 0.1–1.0)
Monocytes Relative: 7 %
Neutro Abs: 6.5 K/uL (ref 1.7–7.7)
Neutrophils Relative %: 82 %
Platelets: 184 K/uL (ref 150–400)
RBC: 4.54 MIL/uL (ref 3.87–5.11)
RDW: 13.1 % (ref 11.5–15.5)
WBC: 7.9 K/uL (ref 4.0–10.5)
nRBC: 0 % (ref 0.0–0.2)

## 2024-01-24 LAB — BASIC METABOLIC PANEL WITH GFR
Anion gap: 8 (ref 5–15)
BUN: 23 mg/dL (ref 8–23)
CO2: 24 mmol/L (ref 22–32)
Calcium: 9.8 mg/dL (ref 8.9–10.3)
Chloride: 108 mmol/L (ref 98–111)
Creatinine, Ser: 0.83 mg/dL (ref 0.44–1.00)
GFR, Estimated: >60 mL/min (ref >60)
Glucose, Bld: 129 mg/dL — ABNORMAL HIGH (ref 70–99)
Potassium: 4.3 mmol/L (ref 3.5–5.1)
Sodium: 140 mmol/L (ref 135–145)

## 2024-01-24 LAB — URINALYSIS, ROUTINE W REFLEX MICROSCOPIC
Bilirubin Urine: NEGATIVE
Glucose, UA: NEGATIVE mg/dL
Hgb urine dipstick: NEGATIVE
Ketones, ur: 5 mg/dL — AB
Leukocytes,Ua: NEGATIVE
Nitrite: NEGATIVE
Protein, ur: NEGATIVE mg/dL
Specific Gravity, Urine: 1.013 (ref 1.005–1.030)
pH: 7 (ref 5.0–8.0)

## 2024-01-24 LAB — HEPATIC FUNCTION PANEL
ALT: 18 U/L (ref 0–44)
AST: 24 U/L (ref 15–41)
Albumin: 3.9 g/dL (ref 3.5–5.0)
Alkaline Phosphatase: 52 U/L (ref 38–126)
Bilirubin, Direct: 0.2 mg/dL (ref 0.0–0.2)
Indirect Bilirubin: 0.3 mg/dL (ref 0.3–0.9)
Total Bilirubin: 0.5 mg/dL (ref 0.0–1.2)
Total Protein: 6.4 g/dL — ABNORMAL LOW (ref 6.5–8.1)

## 2024-01-24 LAB — LIPASE, BLOOD: Lipase: 60 U/L — ABNORMAL HIGH (ref 11–51)

## 2024-01-24 MED ORDER — MECLIZINE HCL 25 MG PO TABS
25.0000 mg | ORAL_TABLET | Freq: Once | ORAL | Status: AC
  Administered 2024-01-24: 25 mg via ORAL
  Filled 2024-01-24: qty 1

## 2024-01-24 MED ORDER — ONDANSETRON 4 MG PO TBDP: 4.0000 mg | ORAL_TABLET | Freq: Three times a day (TID) | ORAL | 0 refills | Status: DC | PRN

## 2024-01-24 MED ORDER — ONDANSETRON HCL 4 MG/2ML IJ SOLN
4.0000 mg | Freq: Once | INTRAMUSCULAR | Status: AC
Start: 2024-01-24 — End: 2024-01-24
  Administered 2024-01-24: 4 mg via INTRAVENOUS
  Filled 2024-01-24: qty 2

## 2024-01-24 MED ORDER — IOHEXOL 350 MG/ML SOLN
75.0000 mL | Freq: Once | INTRAVENOUS | Status: AC | PRN
  Administered 2024-01-24: 75 mL via INTRAVENOUS

## 2024-01-24 MED ORDER — MECLIZINE HCL 25 MG PO TABS: 25.0000 mg | ORAL_TABLET | Freq: Three times a day (TID) | ORAL | 0 refills | Status: DC | PRN

## 2024-01-24 MED ORDER — LACTATED RINGERS IV BOLUS
1000.0000 mL | Freq: Once | INTRAVENOUS | Status: AC
  Administered 2024-01-24: 1000 mL via INTRAVENOUS

## 2024-01-24 NOTE — Discharge Instructions (Signed)
 Thank you for coming to [facility] Emergency Department. You were seen for room spinning dizziness. We did an exam, labs, and imaging, and these showed no acute findings. You likely experienced vertigo today. You can take meclizine 25 mg every 8 hours as needed for dizziness, and zofran 4 mg under the tongue every 6-8 hours as needed for nausea/vomiting. Please stay well-hydrated at home. Please do not take prescription medication that is not yours.   Please follow up with your primary care provider within 1 week.   Do not hesitate to return to the ED or call 911 if you experience: -Worsening symptoms -Slurred speech, confusion, asymmetric numbness/tingling, asymmetric weakness, visual changes, severe headache -Lightheadedness, passing out -Fevers/chills -Anything else that concerns you

## 2024-01-24 NOTE — ED Provider Notes (Signed)
 [place] [facility] AT [facility] Provider Note   CSN: [id number] Arrival date & time: 01/24/24  1319     Patient presents with: Near Syncope   Caitlyn Fields is a 73 y.o. female.   73 year old female presents for evaluation of nausea vomiting and dizziness.  She states she took half of a tablet of Adderall last night and again this morning.  States she woke up this morning is not feeling well.  States she feels dizzy like the room is spinning, describing it as severe vertigo.  States she has also been very nauseous and throwing up.  States the dizziness has not changed with positions and she was unable to walk earlier due to the dizziness.  Denies any other symptoms or concerns.   Near Syncope Pertinent negatives include no chest pain, no abdominal pain and no shortness of breath.       Prior to Admission medications   Medication Sig Start Date End Date Taking? Authorizing Provider  doxycycline  (VIBRAMYCIN ) 100 MG capsule Take 1 capsule (100 mg total) by mouth 2 (two) times daily. Patient not taking: Reported on 12/31/2023 01/25/21   Horton, Kristie M, DO  FLUoxetine (PROZAC) 10 MG capsule Take 10 mg by mouth in the morning, at noon, and at bedtime.    [provider]  loratadine (CLARITIN) 10 MG tablet Take 10 mg by mouth daily as needed for allergies.    [provider]  Misc Natural Products (NEURIVA PO) Take by mouth.    [provider]  omeprazole  (PRILOSEC) 20 MG capsule Take 1 capsule (20 mg total) by mouth daily. Daily for 2 weeks, then as needed. 08/03/14   Diedra Rocky PARAS, MD  valACYclovir  (VALTREX ) 1000 MG tablet At the start of an outbreak take 1 tablet daily for 5 days. 09/16/23   Christopher Savannah, PA-C    Allergies: Crestor [rosuvastatin], Lipitor [atorvastatin], Naproxen, Sulfa antibiotics, and Zetia [ezetimibe]    Review of Systems  Constitutional:  Negative for chills and fever.  HENT:  Negative for ear pain and  sore throat.   Eyes:  Negative for pain and visual disturbance.  Respiratory:  Negative for cough and shortness of breath.   Cardiovascular:  Positive for near-syncope. Negative for chest pain and palpitations.  Gastrointestinal:  Positive for nausea and vomiting. Negative for abdominal pain.  Genitourinary:  Negative for dysuria and hematuria.  Musculoskeletal:  Negative for arthralgias and back pain.  Skin:  Negative for color change and rash.  Neurological:  Positive for dizziness. Negative for seizures and syncope.  All other systems reviewed and are negative.   Updated Vital Signs BP (!) 147/73 (BP Location: Left Arm)   Pulse 64   Resp 18   SpO2 97%   Physical Exam Vitals and nursing note reviewed.  Constitutional:      General: She is not in acute distress.    Appearance: Normal appearance. She is well-developed. She is not ill-appearing.  HENT:     Head: Normocephalic and atraumatic.  Eyes:     Conjunctiva/sclera: Conjunctivae normal.  Cardiovascular:     Rate and Rhythm: Normal rate and regular rhythm.     Heart sounds: No murmur heard. Pulmonary:     Effort: Pulmonary effort is normal. No respiratory distress.     Breath sounds: Normal breath sounds.  Abdominal:     Palpations: Abdomen is soft.     Tenderness: There is no abdominal tenderness.  Musculoskeletal:  General: No swelling.     Cervical back: Neck supple.  Skin:    General: Skin is warm and dry.     Capillary Refill: Capillary refill takes less than 2 seconds.  Neurological:     General: No focal deficit present.     Mental Status: She is alert.  Psychiatric:        Mood and Affect: Mood normal.     (all labs ordered are listed, but only abnormal results are displayed) Labs Reviewed  BASIC METABOLIC PANEL WITH GFR  CBC WITH DIFFERENTIAL/PLATELET  URINALYSIS, ROUTINE W REFLEX MICROSCOPIC  LIPASE, BLOOD  HEPATIC FUNCTION PANEL    EKG: EKG Interpretation Date/Time:  Thursday January 24 2024 13:48:50 EDT Ventricular Rate:  66 PR Interval:  152 QRS Duration:  100 QT Interval:  442 QTC Calculation: 464 R Axis:   53  Text Interpretation: Sinus rhythm Compared with prior EKG from 12/31/2023 Confirmed by Gennaro Bouchard ([id number]) on 01/24/2024 1:56:29 PM  Radiology: No results found.   Procedures   Medications Ordered in the ED  ondansetron [facility]) injection 4 mg (4 mg Intravenous Given 01/24/24 1502)  meclizine (ANTIVERT) tablet 25 mg (25 mg Oral Given 01/24/24 1536)  lactated ringers bolus 1,000 mL (1,000 mLs Intravenous New Bag/Given 01/24/24 1502)                                    Medical Decision Making Cardiac monitor interpretation: Sinus rhythm, no ectopy  Patient here for nausea and vomiting that started this morning as well as some vertigo.  She is feeling very dizzy had some trouble walking earlier due to dizziness.  Neuroexam is negative.  She has no signs of ataxia.  She has not vomited in the ER.  Will give her meclizine Zofran and IV fluids.  Lab workup and CTA pending at this time. Discussed patient's case with oncoming provider at 3:30 PM pending remainder of workup and ultimate disposition.   Problems Addressed: Nausea and vomiting, unspecified vomiting type: acute illness or injury Vertigo: acute illness or injury  Amount and/or Complexity of Data Reviewed External Data Reviewed: notes.    Details: Prior ED records reviewed and patient seen-6-6/25 for conjunctivitis Labs: ordered. Decision-making details documented in ED Course.    Details: Ordered and pending Radiology: ordered and independent interpretation performed. Decision-making details documented in ED Course.    Details: Ordered and pending ECG/medicine tests: ordered and independent interpretation performed. Decision-making details documented in ED Course.    Details: Ordered and interpreted me in the absence of cardiology and shows a sinus rhythm, no STEMI or acute changes when  compared to prior  Risk OTC drugs. Prescription drug management. Drug therapy requiring intensive monitoring for toxicity. Diagnosis or treatment significantly limited by social determinants of health.     Final diagnoses:  Vertigo  Nausea and vomiting, unspecified vomiting type    ED Discharge Orders     None          Gennaro Bouchard CROME, DO 01/24/24 1538

## 2024-01-24 NOTE — ED Triage Notes (Signed)
 Pt BIBA from home for dizziness, nausea, vomiting/dry heaving, starting upon waking for 2nd time today around 1130 am.   Per EMS pt states she took 1/4 of her boyfriend's adderall around 11am and another 1/4 @ 5pm yesterday.  Unknown dosage.  Pt awakened at 8am today and felt fine, went back to sleep and awakened 2nd time @ 1130 feeling dizzy, nausea, dry heave and balance issues.    V/S within range A&O x 4

## 2024-01-24 NOTE — ED Provider Notes (Signed)
 Assumed care of patient from off-going team. For more details, please see note from same day.  In brief, this is a 73 y.o. female p/w dizziness, nausea/vomiting, difficulty with balance.   Plan/Dispo at time of sign-out & ED Course since sign-out: [ ]  labs, CTA H&N, meclizine/fluids/zofran  BP 129/73   Pulse 63   Temp (!) 97.3 F (36.3 C) (Oral)   Resp 18   SpO2 94%    ED Course:   Clinical Course as of 01/24/24 1825  Thu Jan 24, 2024  1707 Urinalysis, Routine w reflex microscopic -Urine, Clean Catch(!) No UTI [HN]  1820 CT Angio Head W or Wo Contrast 1. Negative CTA of the head. 2. Unremarkable noncontrast CT appearance of the brain for age.   [HN]  1824 Patient reevaluated, she feels much improved. Labs/UA/CTA H&N very reassuring. She improved after fluids, zofran, meclizine. Was ambulated without difficulty. She states her sxs have resolved. Will prescribe meclizine PRN and zofran ODT PRN, advised f/u with PCP within 1-2 weeks. Given specific return precautions for stroke-like sxs or return/worsening of sxs. DC w/ discharge instructions/return precautions. All questions answered to patient's satisfaction.   [HN]    Clinical Course User Index [HN] Franklyn Sid SAILOR, MD    Dispo: DC ------------------------------- Sid Franklyn, MD Emergency Medicine  This note was created using dictation software, which may contain spelling or grammatical errors.   Franklyn Sid SAILOR, MD 01/24/24 [phone]

## 2024-01-24 NOTE — ED Notes (Signed)
 Pt reports feeling better. Ambulated pt down hall pt reports no dizziness, steady gait, pt took a few wrong step but maintained good balance.

## 2024-04-02 ENCOUNTER — Ambulatory Visit: Attending: Cardiology | Admitting: Pharmacist

## 2024-04-02 ENCOUNTER — Other Ambulatory Visit (HOSPITAL_COMMUNITY): Payer: Self-pay

## 2024-04-02 ENCOUNTER — Encounter: Payer: Self-pay | Admitting: Pharmacist

## 2024-04-02 DIAGNOSIS — E78019 Familial hypercholesterolemia, unspecified: Secondary | ICD-10-CM

## 2024-04-02 MED ORDER — ROSUVASTATIN CALCIUM 5 MG PO TABS
5.0000 mg | ORAL_TABLET | Freq: Every day | ORAL | 3 refills | Status: AC
  Filled 2024-04-02: qty 90, 90d supply, fill #0

## 2024-04-02 NOTE — Assessment & Plan Note (Signed)
 Assessment: LDL cholesterol and triglycerides elevated although labs may have not been fasting Patient does minimal exercise States her diet is pretty good but feels like there is room for improvement Patient open to the idea of trying statins again Patient would prefer to retry statins before trying injections  Plan: Start rosuvastatin  5 mg daily Increase physical activity Recheck labs first week of March

## 2024-04-02 NOTE — Progress Notes (Signed)
 Patient ID: AKYRA BOUCHIE                 DOB: 12-Sep-1950                    MRN: [id number]      HPI: Caitlyn Fields is a 73 y.o. female patient referred to lipid clinic by Dr. Floretta. PMH is significant for mild cognitive impairment, anxiety and prior tobacco use (quit at age 11), ?familiar hyperlipidemia.   Patient seen by Dr. Floretta and lipid labs were drawn.  Her LDL cholesterol in 22 and 23 was in the 170s.  LDL cholesterol drawn in September was 127.  Her triglycerides were elevated which may have falsely lowered her LDL cholesterol.  Historically her triglycerides are not high which makes me think that she probably was not fasting.  Patient presents today to lipid clinic.  She remembers trying rosuvastatin  and atorvastatin and states she had side effects but she is really not clear on what those side effects were.  States that she knew someone who had memory issues with statins and thinks that she possibly had a negative thought toward statins.  She states that she rather retry statins then try the injections.  She admits that she does not do much exercise.  She used to walk frequently with her 2 dogs.  But unfortunately they passed away in 05/02/2022.  I recommended that she find a neighbor or friend to walk with.  Current Medications: fish oil Intolerances: rosuvastatin , atorvastatin, zetia Risk Factors: Age, LDL cholesterol historically above 160 LDL-C goal: <70 ApoB goal:   Diet: Not discussed in much detail, handout given to patient  Exercise: none  Family History:  Family History  Problem Relation Age of Onset   Heart murmur Father    Alcoholism Father    Heart attack Father        Deceased, 94   Depression Mother    Lung cancer Mother        Deceased, 43   Depression Brother    Healthy Brother    Depression Brother    Healthy Son        Twins     Social History: quite tobacco when she was 29, rare ETOH  Labs: Lipid Panel     Component Value  Date/Time   CHOL 229 (H) 12/31/2023 1549   TRIG 303 (H) 12/31/2023 1549   HDL 48 12/31/2023 1549   CHOLHDL 4.8 (H) 12/31/2023 1549   CHOLHDL 5.7 Ratio 09/27/2009 May 03, 2043   VLDL 23 09/27/2009 2043/05/03   LDLCALC 127 (H) 12/31/2023 1549   LABVLDL 54 (H) 12/31/2023 1549    Past Medical History:  Diagnosis Date   BMI 25.0-25.9,adult    Depression    Elevated cholesterol    Fibrocystic breast disease    Headache(784.0)    Hearing loss    HSV infection    Neuropathy of both feet    Neuropathy of lower extremity    Osteopenia    Osteopenia    SOB (shortness of breath)    Vitamin D  deficiency     Medications Ordered Prior to Encounter[1]  Allergies[2]  Assessment/Plan:  1. Hyperlipidemia -  Familial hypercholesterolemia Assessment: LDL cholesterol and triglycerides elevated although labs may have not been fasting Patient does minimal exercise States her diet is pretty good but feels like there is room for improvement Patient open to the idea of trying statins again Patient would prefer to retry statins before trying injections  Plan: Start rosuvastatin  5 mg daily Increase physical activity Recheck labs first week of March    Thank you,  Bashar Milam D Weda Baumgarner, Pharm.JONETTA SARAN, CPP [facility] A Division of [facility] [address] [place], [place] [address]  Phone: [phone]; Fax: [phone]           [1]  Current Outpatient Medications on File Prior to Visit  Medication Sig Dispense Refill   doxycycline  (VIBRAMYCIN ) 100 MG capsule Take 1 capsule (100 mg total) by mouth 2 (two) times daily. (Patient not taking: Reported on 12/31/2023) 14 capsule 0   FLUoxetine (PROZAC) 10 MG capsule Take 10 mg by mouth in the morning, at noon, and at bedtime.     loratadine (CLARITIN) 10 MG tablet Take 10 mg by mouth daily as needed for allergies.     meclizine  (ANTIVERT ) 25 MG tablet Take 1 tablet (25 mg total) by mouth 3 (three) times daily as  needed for dizziness. 30 tablet 0   Misc Natural Products (NEURIVA PO) Take by mouth.     omeprazole  (PRILOSEC) 20 MG capsule Take 1 capsule (20 mg total) by mouth daily. Daily for 2 weeks, then as needed. 30 capsule 0   ondansetron  (ZOFRAN -ODT) 4 MG disintegrating tablet Take 1 tablet (4 mg total) by mouth every 8 (eight) hours as needed. 20 tablet 0   valACYclovir  (VALTREX ) 1000 MG tablet At the start of an outbreak take 1 tablet daily for 5 days. 30 tablet 0   No current facility-administered medications on file prior to visit.  [2]  Allergies Allergen Reactions   Crestor  [Rosuvastatin ]    Lipitor [Atorvastatin]    Naproxen    Sulfa Antibiotics Itching   Zetia [Ezetimibe]

## 2024-04-02 NOTE — Patient Instructions (Addendum)
 Please start taking rosuvastatin  5mg  once a day Please call me at [phone] if you have any issues We will repeat your labs the first week in March  [facility] locations: [facility] - [address] First floor - [address] ([facility])  - [address] [phone]    [facility] [employer] office - [address] [facility] - [address]   [place]  -[address] Third floor ([facility]) [phone] [address]    [place] - [address]  - [address] [place]  - [address] [place] - [address] ([facility])

## 2024-04-18 ENCOUNTER — Ambulatory Visit
Admission: EM | Admit: 2024-04-18 | Discharge: 2024-04-18 | Disposition: A | Discharge status: Home / Self Care | Attending: Nurse Practitioner | Admitting: Nurse Practitioner

## 2024-04-18 ENCOUNTER — Other Ambulatory Visit: Payer: Self-pay

## 2024-04-18 DIAGNOSIS — J069 Acute upper respiratory infection, unspecified: Secondary | ICD-10-CM

## 2024-04-18 DIAGNOSIS — R051 Acute cough: Secondary | ICD-10-CM | POA: Diagnosis not present

## 2024-04-18 LAB — POC COVID19/FLU A&B COMBO
Covid Antigen, POC: NEGATIVE
Influenza A Antigen, POC: NEGATIVE
Influenza B Antigen, POC: NEGATIVE

## 2024-04-18 NOTE — ED Triage Notes (Signed)
 Pt reports she has had a coughing and sneezing for 3 days.

## 2024-04-18 NOTE — Discharge Instructions (Addendum)
 You were seen today for symptoms of a viral upper respiratory infection, which is most consistent with a common viral illness. Your flu and COVID tests were negative, and your exam did not show signs of pneumonia or a serious infection. These types of infections usually improve on their own with time and supportive care.  To help you feel better, rest as much as possible and drink plenty of fluids to keep mucus thin and easier to clear. Warm fluids such as tea or broth may help soothe your throat and cough. You may use cough drops, honey, a humidifier, or saline nasal spray to help with congestion. Over-the-counter medications for congestion, cough, or fever may be used as directed if you are able to take them safely. Mild fever around 66F can be normal with viral illnesses and does not by itself indicate a serious infection.  Follow up with your primary care provider if your symptoms last longer than 7-10 days, worsen instead of improving, or if you develop ongoing sinus pain, worsening cough, or new concerns. Go to the emergency department right away if you develop a high fever of 101F or higher, difficulty breathing, chest pain, confusion, extreme weakness, rapid heart rate, persistent vomiting, signs of dehydration, or if you feel suddenly much worse. These symptoms could indicate a more serious condition and need urgent evaluation.

## 2024-04-18 NOTE — ED Provider Notes (Signed)
 " [facility]    CSN: [id number] Arrival date & time: 04/18/24  0800      History   Chief Complaint Chief Complaint  Patient presents with   Cough    HPI Caitlyn Fields is a 74 y.o. female.   Discussed the use of AI scribe software for clinical note transcription with the patient, who gave verbal consent to proceed.   The patient presents with a 3-day history of upper respiratory symptoms. Symptoms began with sneezing and have since progressed to include coughing, rhinorrhea, and a low-grade fever, with a reported maximum temperature of 23F. She denies body aches. She reports a possible mild headache at one point but describes it as not severe or concerning. She denies sore throat, nausea, vomiting, or diarrhea. She has attempted symptomatic relief with cough drops and increased fluid intake, including pomegranate juice, with minimal improvement. She reports recent exposure to family members who have been ill but is unsure whether they tested positive for any specific illness. The patient expresses significant anxiety regarding the possibility of sepsis, noting that her ex-husband recently passed away from sepsis and that she has limited understanding of how sepsis develops or presents.  The following sections of the patient's history were reviewed and updated as appropriate: allergies, current medications, past family history, past medical history, past social history, past surgical history, and problem list.     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      Home Medications    Prior to Admission medications  Medication Sig Start Date End Date Taking? Authorizing Provider  FLUoxetine (PROZAC) 10 MG capsule Take 10 mg by mouth in the morning, at noon, and at bedtime.   Yes [provider]  rosuvastatin  (CRESTOR ) 5 MG tablet Take 1 tablet (5 mg total) by mouth daily. 04/02/24 07/01/24 Yes Maccia, Melissa D, RPH-CPP    Family History Family History  Problem Relation Age of Onset   Heart murmur Father    Alcoholism Father    Heart attack Father        Deceased, 82   Depression Mother    Lung cancer Mother        Deceased, 64   Depression Brother    Healthy Brother    Depression Brother    Healthy Son        Twins    Social History Social History[1]   Allergies   Crestor  [rosuvastatin ], Lipitor [atorvastatin], Naproxen, Sulfa antibiotics, and Zetia [ezetimibe]   Review of Systems Review of Systems  Constitutional:  Positive for fever (low grade, TMAX 99). Negative for chills.  HENT:  Positive for congestion, rhinorrhea and sneezing. Negative for postnasal drip and sore throat.   Respiratory:  Positive  for cough. Negative for shortness of breath and wheezing.   Gastrointestinal:  Negative for diarrhea, nausea and vomiting.  Musculoskeletal:  Negative for myalgias.  Neurological:  Negative for headaches.  All other systems reviewed and are negative.    Physical Exam Triage Vital Signs ED Triage Vitals  Encounter Vitals Group     BP 04/18/24 0826 128/79     Girls Systolic BP Percentile --      Girls Diastolic BP Percentile --      Boys Systolic BP Percentile --      Boys Diastolic BP Percentile --      Pulse Rate 04/18/24 0826 69     Resp 04/18/24 0826 18     Temp 04/18/24 0826  97.7 F (36.5 C)     Temp src --      SpO2 04/18/24 0826 98 %     Weight --      Height --      Head Circumference --      Peak Flow --      Pain Score 04/18/24 0822 0     Pain Loc --      Pain Education --      Exclude from Growth Chart --    No data found.  Updated Vital Signs BP 128/79   Pulse 69   Temp 97.7 F (36.5 C)   Resp 18   SpO2 98%   Visual Acuity Right Eye Distance:   Left Eye Distance:   Bilateral Distance:    Right Eye Near:   Left Eye Near:    Bilateral Near:     Physical Exam Vitals reviewed.  Constitutional:      General: She is awake. She is not in acute distress.    Appearance: Normal appearance. She is well-developed. She is not ill-appearing, toxic-appearing or diaphoretic.  HENT:     Head: Normocephalic.     Right Ear: Hearing, tympanic membrane, ear canal and external ear normal. No drainage, swelling or tenderness. No middle ear effusion. Tympanic membrane is not erythematous.     Left Ear: Hearing, tympanic membrane, ear canal and external ear normal. No drainage, swelling or tenderness.  No middle ear effusion. Tympanic membrane is not erythematous.     Nose: Rhinorrhea present. Rhinorrhea is clear.     Mouth/Throat:     Lips: Pink.     Mouth: Mucous membranes are moist.     Pharynx: Oropharynx is clear. Uvula midline. No pharyngeal swelling, oropharyngeal exudate, posterior oropharyngeal erythema or uvula swelling.     Tonsils: No tonsillar exudate or tonsillar abscesses.  Eyes:     General: Vision grossly intact.     Conjunctiva/sclera: Conjunctivae normal.  Cardiovascular:     Rate and Rhythm: Normal rate and regular rhythm.     Heart sounds: Normal heart sounds.  Pulmonary:     Effort: Pulmonary effort is normal. No tachypnea or respiratory distress.     Breath sounds: Normal breath sounds and air entry.     Comments: Rhonchi present initially which cleared with patient coughing. Lungs clear to auscultation after coughing.  Respirations even and unlabored. Musculoskeletal:        General: Normal range of motion.     Cervical back: Full passive range of motion without pain, normal range of motion and neck supple.  Lymphadenopathy:     Cervical: No cervical adenopathy.  Skin:    General: Skin is warm and dry.  Neurological:     General: No focal  deficit present.     Mental Status: She is alert and oriented to person, place, and time.  Psychiatric:        Mood and Affect: Mood is depressed. Affect is tearful.        Speech: Speech normal.        Behavior: Behavior normal. Behavior is cooperative.     Comments: The patient is tearful and expressed distress related to the recent loss of her ex-husband. She appeared anxious and concerned       UC Treatments / Results  Labs (all labs ordered are listed, but only abnormal results are displayed) Labs Reviewed  POC COVID19/FLU A&B COMBO    EKG   Radiology No results found.  Procedures Procedures (including critical care time)  Medications Ordered in UC Medications - No data to display  Initial Impression / Assessment and Plan / UC Course  I have reviewed the triage vital signs and the nursing notes.  Pertinent labs & imaging results that were available during my care of the patient were reviewed by me and considered in my medical decision making (see chart for details).     The patient presents with a 3-day history of upper respiratory symptoms that began with sneezing and progressed to a productive cough, accompanied by rhinorrhea, nasal congestion, and a reported low-grade fever with a maximum temperature of 78F. She denies wheezing, shortness of breath, or significant throat pain. She reports recent exposure to ill family members. Physical examination is notable for lung congestion that clears with coughing, consistent with mobile secretions and not suggestive of consolidation. She is afebrile at the time of evaluation, nontoxic in appearance,  and in no acute distress. Rapid nasal swab testing for influenza and COVID-19 is negative, supporting a diagnosis of an acute viral upper respiratory infection. The patient was advised on supportive care measures including increased oral fluid intake and symptomatic management. Given her heightened concern related to a recent family loss from sepsis, extensive reassurance and education were provided regarding sepsis and pneumonia warning signs. She was instructed to follow up with her primary care provider if symptoms persist or worsen and to seek emergency care for high or persistent fever, worsening cough, chest pain, shortness of breath, confusion, weakness, or any signs of clinical deterioration.  Today's evaluation has revealed no signs of a dangerous process. Discussed diagnosis with patient and/or guardian. Patient and/or guardian aware of their diagnosis, possible red flag symptoms to watch out for and need for close follow up. Patient and/or guardian understands verbal and written discharge instructions. Patient and/or guardian comfortable with plan and disposition.  Patient and/or guardian has a clear mental status at this time, good insight into illness (after discussion and teaching) and has clear judgment to make decisions regarding their care  Documentation was completed with the aid of voice recognition software. Transcription may contain typographical errors.  Final Clinical Impressions(s) / UC Diagnoses   Final diagnoses:  Acute cough  Viral upper respiratory tract infection     Discharge Instructions      You were seen today for symptoms of a viral upper respiratory infection, which is most consistent with a common viral illness. Your flu and COVID tests were negative, and your exam did not show signs of pneumonia or a serious infection. These types of infections usually improve on their own with time and supportive care.  To help you feel better, rest as much as possible and  drink plenty of fluids to keep mucus thin and  easier to clear. Warm fluids such as tea or broth may help soothe your throat and cough. You may use cough drops, honey, a humidifier, or saline nasal spray to help with congestion. Over-the-counter medications for congestion, cough, or fever may be used as directed if you are able to take them safely. Mild fever around 50F can be normal with viral illnesses and does not by itself indicate a serious infection.  Follow up with your primary care provider if your symptoms last longer than 7-10 days, worsen instead of improving, or if you develop ongoing sinus pain, worsening cough, or new concerns. Go to the emergency department right away if you develop a high fever of 101F or higher, difficulty breathing, chest pain, confusion, extreme weakness, rapid heart rate, persistent vomiting, signs of dehydration, or if you feel suddenly much worse. These symptoms could indicate a more serious condition and need urgent evaluation.     ED Prescriptions   None    PDMP not reviewed this encounter.      [1]  Social History Tobacco Use   Smoking status: Former    Current packs/day: 1.00    Average packs/day: 1 pack/day for 6.0 years (6.0 ttl pk-yrs)    Types: Cigarettes   Smokeless tobacco: Never  Vaping Use   Vaping status: Never Used  Substance Use Topics   Alcohol use: No    Alcohol/week: 0.0 standard drinks of alcohol   Drug use: Not Currently     Iola Lukes, FNP 04/18/24 1018  "
